# Patient Record
Sex: Female | Born: 1955 | Race: White | Hispanic: No | Marital: Married | Smoking: Never smoker
Health system: Southern US, Community
[De-identification: ages and names within clinical notes are randomized; demographics above are authoritative.]

## PROBLEM LIST (undated history)

## (undated) DIAGNOSIS — E041 Nontoxic single thyroid nodule: Secondary | ICD-10-CM

## (undated) DIAGNOSIS — K219 Gastro-esophageal reflux disease without esophagitis: Secondary | ICD-10-CM

## (undated) DIAGNOSIS — J302 Other seasonal allergic rhinitis: Secondary | ICD-10-CM

## (undated) DIAGNOSIS — E785 Hyperlipidemia, unspecified: Secondary | ICD-10-CM

## (undated) HISTORY — DX: Hyperlipidemia, unspecified: E78.5

---

## 1982-05-08 HISTORY — PX: WISDOM TOOTH EXTRACTION: SHX21

## 2004-06-21 ENCOUNTER — Ambulatory Visit: Payer: Self-pay | Admitting: Family Medicine

## 2004-06-29 ENCOUNTER — Ambulatory Visit: Payer: Self-pay | Admitting: Family Medicine

## 2005-07-12 ENCOUNTER — Ambulatory Visit: Payer: Self-pay | Admitting: Family Medicine

## 2006-10-30 ENCOUNTER — Ambulatory Visit: Payer: Self-pay | Admitting: Family Medicine

## 2012-05-08 HISTORY — PX: OTHER SURGICAL HISTORY: SHX169

## 2012-09-18 ENCOUNTER — Other Ambulatory Visit: Payer: Self-pay | Admitting: Family Medicine

## 2012-11-21 ENCOUNTER — Other Ambulatory Visit: Payer: Self-pay | Admitting: Family Medicine

## 2013-07-15 ENCOUNTER — Ambulatory Visit (INDEPENDENT_AMBULATORY_CARE_PROVIDER_SITE_OTHER): Payer: BC Managed Care – PPO | Admitting: General Surgery

## 2013-07-15 ENCOUNTER — Encounter (INDEPENDENT_AMBULATORY_CARE_PROVIDER_SITE_OTHER): Payer: Self-pay | Admitting: General Surgery

## 2013-07-15 VITALS — BP 144/96 | HR 80 | Temp 98.5°F | Resp 14 | Ht 63.0 in | Wt 187.0 lb

## 2013-07-15 DIAGNOSIS — E041 Nontoxic single thyroid nodule: Secondary | ICD-10-CM

## 2013-07-15 NOTE — Progress Notes (Signed)
Patient ID: NESREEN ALBANO, female   DOB: [birth year], 57 y.o.   MRN: [id number]  Chief Complaint  Patient presents with  . eval thyroid ca    new pt    HPI TARON CONREY is a 58 y.o. female.  We are asked to see the patient in consultation by Dr. Edrick Oh to evaluate her for a left thyroid nodule. The patient is a 58 year old white female who first noticed a swollen area on her left neck about 2 months ago. She denies any neck pain. She denies any difficulty swallowing solids or liquids. She has had some hoarseness over the last couple of days but she does have seasonal allergies. The area was evaluated with ultrasound and there was a 3 cm dominant nodule in the left lobe of the thyroid gland. There were small nodules in the right lobe but they only measured about 3-4 mm. Her thyroid functions were reported as normal. The dominant nodule on the left side was biopsied and came back as follicular but they could not distinguish adenoma from carcinoma  HPI  History reviewed. No pertinent past medical history.  Past Surgical History  Procedure Laterality Date  . Wisdom tooth extraction  1984  . Dental graft  2015    Family History  Problem Relation Age of Onset  . Stroke Mother   . Heart disease Mother   . Hypertension Father   . Diabetes Father     Social History History  Substance Use Topics  . Smoking status: Never Smoker   . Smokeless tobacco: Not on file  . Alcohol Use: Yes    Allergies  Allergen Reactions  . Sulfa Antibiotics     Current Outpatient Prescriptions  Medication Sig Dispense Refill  . aspirin 81 MG tablet Take 81 mg by mouth daily.      . cetirizine (ZYRTEC) 10 MG tablet Take 10 mg by mouth daily.      Marland Kitchen atorvastatin (LIPITOR) 10 MG tablet       . omeprazole (PRILOSEC) 20 MG capsule        No current facility-administered medications for this visit.    Review of Systems Review of Systems  Constitutional: Negative.   HENT: Negative.   Eyes:  Negative.   Respiratory: Negative.   Cardiovascular: Negative.   Gastrointestinal: Negative.   Endocrine: Negative.   Genitourinary: Negative.   Musculoskeletal: Negative.   Skin: Negative.   Allergic/Immunologic: Negative.   Neurological: Negative.   Hematological: Negative.   Psychiatric/Behavioral: Negative.     Blood pressure 144/96, pulse 80, temperature 98.5 F (36.9 C), temperature source Oral, resp. rate 14, height 5\' 3"  (1.6 m), weight 187 lb (84.823 kg).  Physical Exam Physical Exam  Constitutional: She is oriented to person, place, and time. She appears well-developed and well-nourished.  HENT:  Head: Normocephalic and atraumatic.  Eyes: Conjunctivae and EOM are normal. Pupils are equal, round, and reactive to light.  Neck: Normal range of motion. Neck supple.  There is a palpable nodule in the left lobe of the thyroid gland that measures about 3 cm roughly. There is no palpable nodule of the right thyroid lobe. There is no palpable lymphadenopathy in the neck. There is no obvious hoarseness.  Cardiovascular: Normal rate, regular rhythm and normal heart sounds.   Pulmonary/Chest: Effort normal and breath sounds normal.  Abdominal: Soft. Bowel sounds are normal.  Musculoskeletal: Normal range of motion.  Lymphadenopathy:    She has no cervical adenopathy.  Neurological: She is alert and  oriented to person, place, and time.  Skin: Skin is warm and dry.  Psychiatric: She has a normal mood and affect. Her behavior is normal.    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 Because we cannot distinguish benign for malignant on fine needle biopsy I think that she will require a left thyroid lobectomy. We will examine the right side at the time of the surgery and if the right side is abnormal on the right side may need to be removed as well. I've discussed with her in detail the  risks and benefits of the operation to remove the thyroid gland as well as some of the technical aspects including the risk of injury to the recurrent laryngeal nerve and parathyroid glands and she understands and wishes to proceed     Plan    Plan for left thyroid lobectomy and possible total thyroidectomy        TOTH III,Tamsen Reist S 07/15/2013, 10:47 AM

## 2013-07-15 NOTE — Patient Instructions (Signed)
Plan for left thyroid lobectomy and possible total

## 2013-07-30 ENCOUNTER — Encounter (HOSPITAL_COMMUNITY): Payer: Self-pay | Admitting: Pharmacy Technician

## 2013-08-04 ENCOUNTER — Ambulatory Visit (HOSPITAL_COMMUNITY)
Admission: RE | Admit: 2013-08-04 | Discharge: 2013-08-04 | Disposition: A | Discharge status: Home / Self Care | Payer: BC Managed Care – PPO | Source: Ambulatory Visit | Attending: General Surgery | Admitting: General Surgery

## 2013-08-04 ENCOUNTER — Encounter (HOSPITAL_COMMUNITY)
Admission: RE | Admit: 2013-08-04 | Discharge: 2013-08-04 | Disposition: A | Discharge status: Home / Self Care | Payer: BC Managed Care – PPO | Source: Ambulatory Visit | Attending: General Surgery | Admitting: General Surgery

## 2013-08-04 ENCOUNTER — Encounter (HOSPITAL_COMMUNITY): Payer: Self-pay

## 2013-08-04 DIAGNOSIS — M4130 Thoracogenic scoliosis, site unspecified: Secondary | ICD-10-CM | POA: Insufficient documentation

## 2013-08-04 DIAGNOSIS — Z01818 Encounter for other preprocedural examination: Secondary | ICD-10-CM | POA: Insufficient documentation

## 2013-08-04 DIAGNOSIS — Z01812 Encounter for preprocedural laboratory examination: Secondary | ICD-10-CM | POA: Insufficient documentation

## 2013-08-04 HISTORY — DX: Nontoxic single thyroid nodule: E04.1

## 2013-08-04 HISTORY — DX: Gastro-esophageal reflux disease without esophagitis: K21.9

## 2013-08-04 LAB — BASIC METABOLIC PANEL
BUN: 12 mg/dL (ref 6–23)
CO2: 28 mEq/L (ref 19–32)
Calcium: 10 mg/dL (ref 8.4–10.5)
Chloride: 103 mEq/L (ref 96–112)
Creatinine, Ser: 0.73 mg/dL (ref 0.50–1.10)
Glucose, Bld: 92 mg/dL (ref 70–99)
POTASSIUM: 4.4 meq/L (ref 3.7–5.3)
SODIUM: 141 meq/L (ref 137–147)

## 2013-08-04 LAB — CBC
HCT: 43.7 % (ref 36.0–46.0)
HEMOGLOBIN: 14.6 g/dL (ref 12.0–15.0)
MCH: 30.3 pg (ref 26.0–34.0)
MCHC: 33.4 g/dL (ref 30.0–36.0)
MCV: 90.7 fL (ref 78.0–100.0)
Platelets: 272 10*3/uL (ref 150–400)
RBC: 4.82 MIL/uL (ref 3.87–5.11)
RDW: 12.4 % (ref 11.5–15.5)
WBC: 4.3 10*3/uL (ref 4.0–10.5)

## 2013-08-04 NOTE — Patient Instructions (Signed)
   YOUR SURGERY IS SCHEDULED AT [facility]  ON:  Friday  4/10  REPORT TO  SHORT STAY CENTER AT:  7:30 AM      PHONE # FOR SHORT STAY IS [phone]  DO NOT EAT OR DRINK ANYTHING AFTER MIDNIGHT THE NIGHT BEFORE YOUR SURGERY.  YOU MAY BRUSH YOUR TEETH, RINSE OUT YOUR MOUTH--BUT NO WATER, NO FOOD, NO CHEWING GUM, NO MINTS, NO CANDIES, NO CHEWING TOBACCO.  PLEASE TAKE THE FOLLOWING MEDICATIONS THE AM OF YOUR SURGERY WITH A FEW SIPS OF WATER:  NO MEDS TO TAKE    DO NOT BRING VALUABLES, MONEY, CREDIT CARDS.  DO NOT WEAR JEWELRY, MAKE-UP, NAIL POLISH AND NO METAL PINS OR CLIPS IN YOUR HAIR. CONTACT LENS, DENTURES / PARTIALS, GLASSES SHOULD NOT BE WORN TO SURGERY AND IN MOST CASES-HEARING AIDS WILL NEED TO BE REMOVED.  BRING YOUR GLASSES CASE, ANY EQUIPMENT NEEDED FOR YOUR CONTACT LENS. FOR PATIENTS ADMITTED TO THE HOSPITAL--CHECK OUT TIME THE DAY OF DISCHARGE IS 11:00 AM.  ALL INPATIENT ROOMS ARE PRIVATE - WITH BATHROOM, TELEPHONE, TELEVISION AND WIFI INTERNET.                                                     FAILURE TO FOLLOW THESE INSTRUCTIONS MAY RESULT IN THE CANCELLATION OF YOUR SURGERY. PLEASE BE AWARE THAT YOU MAY NEED ADDITIONAL BLOOD DRAWN DAY OF YOUR SURGERY  PATIENT SIGNATURE_________________________________

## 2013-08-04 NOTE — Pre-Procedure Instructions (Signed)
CXR WAS DONE TODAY PREOP AT [facility];  EKG NOT NEEDED - PER ANESTHESIOLOGIST'S GUIDELINES. 

## 2013-08-15 ENCOUNTER — Encounter (HOSPITAL_COMMUNITY)
Admission: RE | Disposition: A | Discharge status: Home / Self Care | Payer: Self-pay | Source: Ambulatory Visit | Attending: General Surgery

## 2013-08-15 ENCOUNTER — Encounter (HOSPITAL_COMMUNITY): Payer: Self-pay | Admitting: *Deleted

## 2013-08-15 ENCOUNTER — Ambulatory Visit (HOSPITAL_COMMUNITY)
Admission: RE | Admit: 2013-08-15 | Discharge: 2013-08-15 | Disposition: A | Discharge status: Home / Self Care | Payer: BC Managed Care – PPO | Source: Ambulatory Visit | Attending: General Surgery | Admitting: General Surgery

## 2013-08-15 ENCOUNTER — Encounter (HOSPITAL_COMMUNITY): Payer: BC Managed Care – PPO | Admitting: Anesthesiology

## 2013-08-15 ENCOUNTER — Ambulatory Visit (HOSPITAL_COMMUNITY): Payer: BC Managed Care – PPO | Admitting: Anesthesiology

## 2013-08-15 DIAGNOSIS — E041 Nontoxic single thyroid nodule: Secondary | ICD-10-CM | POA: Insufficient documentation

## 2013-08-15 DIAGNOSIS — Z538 Procedure and treatment not carried out for other reasons: Secondary | ICD-10-CM | POA: Insufficient documentation

## 2013-08-15 DIAGNOSIS — Z7982 Long term (current) use of aspirin: Secondary | ICD-10-CM | POA: Insufficient documentation

## 2013-08-15 DIAGNOSIS — Z79899 Other long term (current) drug therapy: Secondary | ICD-10-CM | POA: Insufficient documentation

## 2013-08-15 SURGERY — LOBECTOMY, THYROID
Anesthesia: General | Laterality: Left

## 2013-08-15 MED ORDER — ROCURONIUM BROMIDE 100 MG/10ML IV SOLN
INTRAVENOUS | Status: AC
  Filled 2013-08-15: qty 1

## 2013-08-15 MED ORDER — MIDAZOLAM HCL 2 MG/2ML IJ SOLN
INTRAMUSCULAR | Status: AC
  Filled 2013-08-15: qty 2

## 2013-08-15 MED ORDER — HYDROMORPHONE HCL PF 2 MG/ML IJ SOLN
INTRAMUSCULAR | Status: AC
  Filled 2013-08-15: qty 1

## 2013-08-15 MED ORDER — ONDANSETRON HCL 4 MG/2ML IJ SOLN
INTRAMUSCULAR | Status: AC
  Filled 2013-08-15: qty 2

## 2013-08-15 MED ORDER — CEFAZOLIN SODIUM-DEXTROSE 2-3 GM-% IV SOLR: 2.0000 g | INTRAVENOUS | Status: DC

## 2013-08-15 MED ORDER — LIDOCAINE HCL (CARDIAC) 20 MG/ML IV SOLN
INTRAVENOUS | Status: AC
  Filled 2013-08-15: qty 5

## 2013-08-15 MED ORDER — FENTANYL CITRATE 0.05 MG/ML IJ SOLN
INTRAMUSCULAR | Status: AC
  Filled 2013-08-15: qty 5

## 2013-08-15 MED ORDER — SUCCINYLCHOLINE CHLORIDE 20 MG/ML IJ SOLN
INTRAMUSCULAR | Status: AC
  Filled 2013-08-15: qty 1

## 2013-08-15 MED ORDER — PROPOFOL 10 MG/ML IV BOLUS
INTRAVENOUS | Status: AC
  Filled 2013-08-15: qty 20

## 2013-08-15 NOTE — Anesthesia Preprocedure Evaluation (Signed)
Anesthesia Evaluation  Patient identified by MRN, date of birth, ID band Patient awake    Reviewed: Allergy & Precautions, H&P , NPO status , Patient's Chart, lab work & pertinent test results  Airway Mallampati: II TM Distance: >3 FB Neck ROM: full    Dental no notable dental hx.    Pulmonary neg pulmonary ROS,  breath sounds clear to auscultation  Pulmonary exam normal       Cardiovascular Exercise Tolerance: Good negative cardio ROS  Rhythm:regular Rate:Normal     Neuro/Psych negative neurological ROS  negative psych ROS   GI/Hepatic negative GI ROS, Neg liver ROS, GERD-  Medicated and Controlled,  Endo/Other  negative endocrine ROS  Renal/GU negative Renal ROS  negative genitourinary   Musculoskeletal   Abdominal   Peds  Hematology negative hematology ROS (+)   Anesthesia Other Findings   Reproductive/Obstetrics negative OB ROS                           Anesthesia Physical Anesthesia Plan  ASA: II  Anesthesia Plan: General   Post-op Pain Management:    Induction: Intravenous  Airway Management Planned: Oral ETT  Additional Equipment:   Intra-op Plan:   Post-operative Plan: Extubation in OR  Informed Consent: I have reviewed the patients History and Physical, chart, labs and discussed the procedure including the risks, benefits and alternatives for the proposed anesthesia with the patient or authorized representative who has indicated his/her understanding and acceptance.   Dental Advisory Given  Plan Discussed with: CRNA and Surgeon  Anesthesia Plan Comments:         Anesthesia Quick Evaluation  

## 2013-08-21 NOTE — H&P (Signed)
  Kimberly Sparks  07/15/2013 10:10 AM   Office Visit  MRN:  [id number]   Description: 57 year old female  Provider: Paul S Toth III, MD  Department: [facility]         Guarantor Account: Loeber, Sherly G ([id number])      Relation to Patient: Account Type Service Area      Self Personal/Family [place] SERVICE AREA              Coverages for This Account     Coverage ID Payor Plan Insurance ID      [id number] BLUE CROSS BLUE SHIELD BCBS PPO OUT OF STATE [id number]              Guarantor Account: Wimbish, Elyanna G ([id number])      Relation to Patient: Account Type Service Area      Self Personal/Family [facility]                 Guarantor Account: Eisel, Rayaan G ([id number])      Relation to Patient: Account Type Service Area      Self Personal/Family [facility]                 Guarantor Account: Zacher, Niasia G ([id number])      Relation to Patient: Account Type Service Area      Self Personal/Family [facility] SERV[facility]              Coverages for This Account     Coverage ID Payor Plan Insurance ID      [id number] BLUE CROSS BLUE SHIELD BCBS PPO OUT OF STATE [id number]                Diagnoses      Left thyroid nodule    -  Primary      241.0             Reason for Visit      eval thyroid ca      new pt               Current Vitals Most recent update: 07/15/2013 10:08 AM by Sonya M Bynum, CMA      BP Pulse Temp(Src) Resp Ht Wt      144/96 80 98.5 F (36.9 C) (Oral) 14 5' 3" (1.6 m) 187 lb (84.823 kg)      BMI                33.13 kg/m2                        Progress Notes      Paul S Toth III, MD at 07/15/2013 10:47 AM      Status: Signed            Patient ID: Kimberly Sparks, female   DOB: 09/22/1955, 57 y.o.   MRN: [id number]    Chief Complaint   Patient presents with   .  eval thyroid ca       new pt        HPI Kimberly G  Sparks is a 57 y.o. female.  We are asked to see the patient in consultation by Dr. Nyland to evaluate her for a left thyroid nodule. The patient is a 57-year-old white female who first noticed a swollen area on her left neck about 2 months ago. She   denies any neck pain. She denies any difficulty swallowing solids or liquids. She has had some hoarseness over the last couple of days but she does have seasonal allergies. The area was evaluated with ultrasound and there was a 3 cm dominant nodule in the left lobe of the thyroid gland. There were small nodules in the right lobe but they only measured about 3-4 mm. Her thyroid functions were reported as normal. The dominant nodule on the left side was biopsied and came back as follicular but they could not distinguish adenoma from carcinoma  HPI   History reviewed. No pertinent past medical history.    Past Surgical History   Procedure  Laterality  Date   .  Wisdom tooth extraction    1984   .  Dental graft    2015         Family History   Problem  Relation  Age of Onset   .  Stroke  Mother     .  Heart disease  Mother     .  Hypertension  Father     .  Diabetes  Father          Social History History   Substance Use Topics   .  Smoking status:  Never Smoker    .  Smokeless tobacco:  Not on file   .  Alcohol Use:  Yes         Allergies   Allergen  Reactions   .  Sulfa Antibiotics           Current Outpatient Prescriptions   Medication  Sig  Dispense  Refill   .  aspirin 81 MG tablet  Take 81 mg by mouth daily.         .  cetirizine (ZYRTEC) 10 MG tablet  Take 10 mg by mouth daily.         Marland Kitchen  atorvastatin (LIPITOR) 10 MG tablet           .  omeprazole (PRILOSEC) 20 MG capsule               No current facility-administered medications for this visit.        Review of Systems Review of Systems  Constitutional: Negative.   HENT: Negative.   Eyes: Negative.   Respiratory: Negative.   Cardiovascular: Negative.    Gastrointestinal: Negative.   Endocrine: Negative.   Genitourinary: Negative.   Musculoskeletal: Negative.   Skin: Negative.   Allergic/Immunologic: Negative.   Neurological: Negative.   Hematological: Negative.   Psychiatric/Behavioral: Negative.       Blood pressure 144/96, pulse 80, temperature 98.5 F (36.9 C), temperature source Oral, resp. rate 14, height 5\' 3"  (1.6 m), weight 187 lb (84.823 kg).   Physical Exam Physical Exam  Constitutional: She is oriented to person, place, and time. She appears well-developed and well-nourished.  HENT:   Head: Normocephalic and atraumatic.  Eyes: Conjunctivae and EOM are normal. Pupils are equal, round, and reactive to light.  Neck: Normal range of motion. Neck supple.  There is a palpable nodule in the left lobe of the thyroid gland that measures about 3 cm roughly. There is no palpable nodule of the right thyroid lobe. There is no palpable lymphadenopathy in the neck. There is no obvious hoarseness.  Cardiovascular: Normal rate, regular rhythm and normal heart sounds.   Pulmonary/Chest: Effort normal and breath sounds normal.  Abdominal: Soft. Bowel sounds are normal.  Musculoskeletal: Normal range of motion.  Lymphadenopathy:    She has no cervical adenopathy.  Neurological: She is alert and oriented to person, place, and time.  Skin: Skin is warm and dry.  Psychiatric: She has a normal mood and affect. Her behavior is normal.      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 Because we cannot distinguish benign for malignant on fine needle biopsy I think that she will require a left thyroid lobectomy. We will examine the right side at the time of the surgery and if the right side is abnormal on the right side may need to be removed as well. I've discussed with her in detail the risks and benefits of the operation to remove the  thyroid gland as well as some of the technical aspects including the risk of injury to the recurrent laryngeal nerve and parathyroid glands and she understands and wishes to proceed      Plan    Plan for left thyroid lobectomy and possible total thyroidectomy         

## 2013-08-22 MED ORDER — LACTATED RINGERS IV SOLN: INTRAVENOUS | Status: DC

## 2013-08-22 MED ORDER — HYDROMORPHONE HCL PF 1 MG/ML IJ SOLN: 0.2500 mg | INTRAMUSCULAR | Status: DC | PRN

## 2013-08-25 ENCOUNTER — Encounter (HOSPITAL_COMMUNITY): Payer: Self-pay | Admitting: *Deleted

## 2013-08-26 ENCOUNTER — Encounter (INDEPENDENT_AMBULATORY_CARE_PROVIDER_SITE_OTHER): Payer: Self-pay

## 2013-08-26 ENCOUNTER — Ambulatory Visit (HOSPITAL_COMMUNITY): Payer: BC Managed Care – PPO | Admitting: Anesthesiology

## 2013-08-26 ENCOUNTER — Encounter (HOSPITAL_COMMUNITY): Payer: BC Managed Care – PPO | Admitting: Anesthesiology

## 2013-08-26 ENCOUNTER — Ambulatory Visit (HOSPITAL_COMMUNITY)
Admission: RE | Admit: 2013-08-26 | Discharge: 2013-08-27 | Disposition: A | Discharge status: Home / Self Care | Payer: BC Managed Care – PPO | Source: Ambulatory Visit | Attending: General Surgery | Admitting: General Surgery

## 2013-08-26 ENCOUNTER — Encounter (HOSPITAL_COMMUNITY)
Admission: RE | Disposition: A | Discharge status: Home / Self Care | Payer: Self-pay | Source: Ambulatory Visit | Attending: General Surgery

## 2013-08-26 ENCOUNTER — Ambulatory Visit (HOSPITAL_COMMUNITY): Admission: RE | Admit: 2013-08-26 | Payer: BC Managed Care – PPO | Source: Ambulatory Visit | Admitting: General Surgery

## 2013-08-26 ENCOUNTER — Encounter (HOSPITAL_COMMUNITY): Admission: RE | Payer: Self-pay | Source: Ambulatory Visit

## 2013-08-26 ENCOUNTER — Encounter (HOSPITAL_COMMUNITY): Payer: Self-pay | Admitting: Anesthesiology

## 2013-08-26 DIAGNOSIS — E063 Autoimmune thyroiditis: Secondary | ICD-10-CM

## 2013-08-26 DIAGNOSIS — K219 Gastro-esophageal reflux disease without esophagitis: Secondary | ICD-10-CM | POA: Insufficient documentation

## 2013-08-26 DIAGNOSIS — E042 Nontoxic multinodular goiter: Secondary | ICD-10-CM | POA: Insufficient documentation

## 2013-08-26 DIAGNOSIS — E041 Nontoxic single thyroid nodule: Secondary | ICD-10-CM

## 2013-08-26 HISTORY — DX: Other seasonal allergic rhinitis: J30.2

## 2013-08-26 HISTORY — PX: THYROID LOBECTOMY: SHX420

## 2013-08-26 LAB — BASIC METABOLIC PANEL
BUN: 19 mg/dL (ref 6–23)
CO2: 23 mEq/L (ref 19–32)
Calcium: 9.3 mg/dL (ref 8.4–10.5)
Chloride: 107 mEq/L (ref 96–112)
Creatinine, Ser: 0.83 mg/dL (ref 0.50–1.10)
GFR calc Af Amer: 90 mL/min — ABNORMAL LOW (ref >90)
GFR, EST NON AFRICAN AMERICAN: 77 mL/min — AB (ref >90)
Glucose, Bld: 97 mg/dL (ref 70–99)
POTASSIUM: 4.2 meq/L (ref 3.7–5.3)
SODIUM: 144 meq/L (ref 137–147)

## 2013-08-26 LAB — CBC
HEMATOCRIT: 40.6 % (ref 36.0–46.0)
Hemoglobin: 13.8 g/dL (ref 12.0–15.0)
MCH: 30.9 pg (ref 26.0–34.0)
MCHC: 34 g/dL (ref 30.0–36.0)
MCV: 91 fL (ref 78.0–100.0)
Platelets: 244 10*3/uL (ref 150–400)
RBC: 4.46 MIL/uL (ref 3.87–5.11)
RDW: 12.6 % (ref 11.5–15.5)
WBC: 4.7 10*3/uL (ref 4.0–10.5)

## 2013-08-26 SURGERY — LOBECTOMY, THYROID
Anesthesia: General | Laterality: Left

## 2013-08-26 MED ORDER — OXYCODONE HCL 5 MG/5ML PO SOLN
5.0000 mg | Freq: Once | ORAL | Status: AC | PRN
  Administered 2013-08-26: 5 mg via ORAL

## 2013-08-26 MED ORDER — PROMETHAZINE HCL 25 MG/ML IJ SOLN: 6.2500 mg | INTRAMUSCULAR | Status: DC | PRN

## 2013-08-26 MED ORDER — GLYCOPYRROLATE 0.2 MG/ML IJ SOLN
INTRAMUSCULAR | Status: DC | PRN
  Administered 2013-08-26: 0.4 mg via INTRAVENOUS

## 2013-08-26 MED ORDER — PROMETHAZINE HCL 25 MG/ML IJ SOLN
12.5000 mg | Freq: Once | INTRAMUSCULAR | Status: AC
  Administered 2013-08-26: 12.5 mg via INTRAVENOUS
  Filled 2013-08-26: qty 1

## 2013-08-26 MED ORDER — CEFAZOLIN SODIUM-DEXTROSE 2-3 GM-% IV SOLR
2.0000 g | Freq: Once | INTRAVENOUS | Status: AC
  Administered 2013-08-26: 2 g via INTRAVENOUS
  Filled 2013-08-26: qty 50

## 2013-08-26 MED ORDER — PHENYLEPHRINE 40 MCG/ML (10ML) SYRINGE FOR IV PUSH (FOR BLOOD PRESSURE SUPPORT)
PREFILLED_SYRINGE | INTRAVENOUS | Status: AC
  Filled 2013-08-26: qty 10

## 2013-08-26 MED ORDER — GLYCOPYRROLATE 0.2 MG/ML IJ SOLN
INTRAMUSCULAR | Status: AC
  Filled 2013-08-26: qty 1

## 2013-08-26 MED ORDER — FENTANYL CITRATE 0.05 MG/ML IJ SOLN
INTRAMUSCULAR | Status: AC
  Filled 2013-08-26: qty 5

## 2013-08-26 MED ORDER — ROCURONIUM BROMIDE 100 MG/10ML IV SOLN
INTRAVENOUS | Status: DC | PRN
Start: 2013-08-26 — End: 2013-08-26
  Administered 2013-08-26: 50 mg via INTRAVENOUS

## 2013-08-26 MED ORDER — HYDROMORPHONE HCL PF 1 MG/ML IJ SOLN
INTRAMUSCULAR | Status: AC
  Filled 2013-08-26: qty 1

## 2013-08-26 MED ORDER — PROPOFOL 10 MG/ML IV BOLUS
INTRAVENOUS | Status: DC | PRN
Start: 2013-08-26 — End: 2013-08-26
  Administered 2013-08-26: 200 mg via INTRAVENOUS

## 2013-08-26 MED ORDER — LIDOCAINE HCL (CARDIAC) 20 MG/ML IV SOLN
INTRAVENOUS | Status: AC
  Filled 2013-08-26: qty 5

## 2013-08-26 MED ORDER — MIDAZOLAM HCL 5 MG/5ML IJ SOLN
INTRAMUSCULAR | Status: DC | PRN
  Administered 2013-08-26: 2 mg via INTRAVENOUS

## 2013-08-26 MED ORDER — MIDAZOLAM HCL 2 MG/2ML IJ SOLN
INTRAMUSCULAR | Status: AC
  Filled 2013-08-26: qty 2

## 2013-08-26 MED ORDER — PHENYLEPHRINE HCL 10 MG/ML IJ SOLN
INTRAMUSCULAR | Status: DC | PRN
  Administered 2013-08-26 (×2): 40 ug via INTRAVENOUS
  Administered 2013-08-26 (×3): 80 ug via INTRAVENOUS
  Administered 2013-08-26: 40 ug via INTRAVENOUS

## 2013-08-26 MED ORDER — ONDANSETRON HCL 4 MG/2ML IJ SOLN
INTRAMUSCULAR | Status: DC | PRN
  Administered 2013-08-26: 4 mg via INTRAVENOUS

## 2013-08-26 MED ORDER — EPHEDRINE SULFATE 50 MG/ML IJ SOLN
INTRAMUSCULAR | Status: AC
  Filled 2013-08-26: qty 1

## 2013-08-26 MED ORDER — PROPOFOL 10 MG/ML IV BOLUS
INTRAVENOUS | Status: AC
  Filled 2013-08-26: qty 20

## 2013-08-26 MED ORDER — SODIUM CHLORIDE 0.9 % IJ SOLN
INTRAMUSCULAR | Status: AC
  Filled 2013-08-26: qty 10

## 2013-08-26 MED ORDER — HEMOSTATIC AGENTS (NO CHARGE) OPTIME
TOPICAL | Status: DC | PRN
  Administered 2013-08-26: 1 via TOPICAL

## 2013-08-26 MED ORDER — OXYCODONE HCL 5 MG PO TABS: 5.0000 mg | ORAL_TABLET | Freq: Once | ORAL | Status: DC | PRN

## 2013-08-26 MED ORDER — OXYCODONE HCL 5 MG/5ML PO SOLN: 5.0000 mg | Freq: Once | ORAL | Status: DC | PRN

## 2013-08-26 MED ORDER — SUCCINYLCHOLINE CHLORIDE 20 MG/ML IJ SOLN
INTRAMUSCULAR | Status: AC
  Filled 2013-08-26: qty 1

## 2013-08-26 MED ORDER — OXYCODONE-ACETAMINOPHEN 5-325 MG PO TABS
1.0000 | ORAL_TABLET | ORAL | Status: DC | PRN
  Administered 2013-08-26 – 2013-08-27 (×2): 1 via ORAL
  Filled 2013-08-26 (×2): qty 1

## 2013-08-26 MED ORDER — LORATADINE 10 MG PO TABS
10.0000 mg | ORAL_TABLET | Freq: Every day | ORAL | Status: DC
  Administered 2013-08-26 – 2013-08-27 (×2): 10 mg via ORAL
  Filled 2013-08-26 (×2): qty 1

## 2013-08-26 MED ORDER — LIDOCAINE HCL (CARDIAC) 20 MG/ML IV SOLN
INTRAVENOUS | Status: DC | PRN
  Administered 2013-08-26: 50 mg via INTRATRACHEAL
  Administered 2013-08-26: 80 mg via INTRAVENOUS

## 2013-08-26 MED ORDER — KCL IN DEXTROSE-NACL 20-5-0.9 MEQ/L-%-% IV SOLN
INTRAVENOUS | Status: DC
  Administered 2013-08-26: 12:00:00 via INTRAVENOUS
  Filled 2013-08-26 (×4): qty 1000

## 2013-08-26 MED ORDER — CALCIUM CARBONATE-VITAMIN D 500-200 MG-UNIT PO TABS
1.0000 | ORAL_TABLET | Freq: Every day | ORAL | Status: DC
  Administered 2013-08-26 – 2013-08-27 (×2): 1 via ORAL
  Filled 2013-08-26 (×2): qty 1

## 2013-08-26 MED ORDER — HYDROMORPHONE HCL PF 1 MG/ML IJ SOLN
0.2500 mg | INTRAMUSCULAR | Status: DC | PRN
  Administered 2013-08-26: 0.5 mg via INTRAVENOUS

## 2013-08-26 MED ORDER — LACTATED RINGERS IV SOLN
INTRAVENOUS | Status: DC
  Administered 2013-08-26: 07:00:00 via INTRAVENOUS

## 2013-08-26 MED ORDER — OXYCODONE HCL 5 MG PO TABS: 5.0000 mg | ORAL_TABLET | Freq: Once | ORAL | Status: AC | PRN

## 2013-08-26 MED ORDER — ONDANSETRON HCL 4 MG/2ML IJ SOLN
4.0000 mg | Freq: Four times a day (QID) | INTRAMUSCULAR | Status: DC | PRN
  Administered 2013-08-26: 4 mg via INTRAVENOUS
  Filled 2013-08-26: qty 2

## 2013-08-26 MED ORDER — MORPHINE SULFATE 4 MG/ML IJ SOLN: 4.0000 mg | INTRAMUSCULAR | Status: DC | PRN

## 2013-08-26 MED ORDER — NEOSTIGMINE METHYLSULFATE 1 MG/ML IJ SOLN
INTRAMUSCULAR | Status: DC | PRN
  Administered 2013-08-26: 3 mg via INTRAVENOUS

## 2013-08-26 MED ORDER — OXYCODONE HCL 5 MG/5ML PO SOLN
ORAL | Status: AC
  Filled 2013-08-26: qty 5

## 2013-08-26 MED ORDER — ONDANSETRON HCL 4 MG PO TABS: 4.0000 mg | ORAL_TABLET | Freq: Four times a day (QID) | ORAL | Status: DC | PRN

## 2013-08-26 MED ORDER — ROCURONIUM BROMIDE 50 MG/5ML IV SOLN
INTRAVENOUS | Status: AC
  Filled 2013-08-26: qty 1

## 2013-08-26 MED ORDER — 0.9 % SODIUM CHLORIDE (POUR BTL) OPTIME
TOPICAL | Status: DC | PRN
  Administered 2013-08-26: 1000 mL

## 2013-08-26 MED ORDER — DEXAMETHASONE SODIUM PHOSPHATE 4 MG/ML IJ SOLN
INTRAMUSCULAR | Status: DC | PRN
Start: 2013-08-26 — End: 2013-08-26
  Administered 2013-08-26: 4 mg via INTRAVENOUS

## 2013-08-26 MED ORDER — HYDROMORPHONE HCL PF 1 MG/ML IJ SOLN
0.2500 mg | INTRAMUSCULAR | Status: DC | PRN
  Administered 2013-08-26 (×2): 0.5 mg via INTRAVENOUS

## 2013-08-26 MED ORDER — LACTATED RINGERS IV SOLN
INTRAVENOUS | Status: DC | PRN
  Administered 2013-08-26 (×2): via INTRAVENOUS

## 2013-08-26 MED ORDER — PROMETHAZINE HCL 25 MG/ML IJ SOLN
6.2500 mg | INTRAMUSCULAR | Status: DC | PRN
Start: 2013-08-26 — End: 2013-08-26

## 2013-08-26 MED ORDER — FENTANYL CITRATE 0.05 MG/ML IJ SOLN
INTRAMUSCULAR | Status: DC | PRN
  Administered 2013-08-26: 100 ug via INTRAVENOUS
  Administered 2013-08-26 (×3): 50 ug via INTRAVENOUS

## 2013-08-26 MED ORDER — ATORVASTATIN CALCIUM 10 MG PO TABS
10.0000 mg | ORAL_TABLET | Freq: Every day | ORAL | Status: DC
  Administered 2013-08-26: 10 mg via ORAL
  Filled 2013-08-26 (×2): qty 1

## 2013-08-26 MED ORDER — ONDANSETRON HCL 4 MG/2ML IJ SOLN
INTRAMUSCULAR | Status: AC
  Filled 2013-08-26: qty 2

## 2013-08-26 MED ORDER — PANTOPRAZOLE SODIUM 40 MG PO TBEC
40.0000 mg | DELAYED_RELEASE_TABLET | Freq: Every day | ORAL | Status: DC
  Administered 2013-08-26 – 2013-08-27 (×2): 40 mg via ORAL
  Filled 2013-08-26 (×2): qty 1

## 2013-08-26 SURGICAL SUPPLY — 52 items
BLADE 10 SAFETY STRL DISP (BLADE) ×3 IMPLANT
BLADE 15 SAFETY STRL DISP (BLADE) ×3 IMPLANT
BLADE SURG ROTATE 9660 (MISCELLANEOUS) IMPLANT
CANISTER SUCTION 2500CC (MISCELLANEOUS) ×3 IMPLANT
CHLORAPREP W/TINT 10.5 ML (MISCELLANEOUS) ×3 IMPLANT
CLIP TI MEDIUM 24 (CLIP) ×3 IMPLANT
CLIP TI WIDE RED SMALL 24 (CLIP) ×3 IMPLANT
COVER SURGICAL LIGHT HANDLE (MISCELLANEOUS) ×3 IMPLANT
CRADLE DONUT ADULT HEAD (MISCELLANEOUS) ×3 IMPLANT
DERMABOND ADVANCED (GAUZE/BANDAGES/DRESSINGS) ×2
DERMABOND ADVANCED .7 DNX12 (GAUZE/BANDAGES/DRESSINGS) ×1 IMPLANT
DRAPE PED LAPAROTOMY (DRAPES) ×3 IMPLANT
DRAPE UTILITY 15X26 W/TAPE STR (DRAPE) ×6 IMPLANT
ELECT CAUTERY BLADE 6.4 (BLADE) ×3 IMPLANT
ELECT REM PT RETURN 9FT ADLT (ELECTROSURGICAL) ×3
ELECTRODE REM PT RTRN 9FT ADLT (ELECTROSURGICAL) ×1 IMPLANT
GAUZE SPONGE 4X4 16PLY XRAY LF (GAUZE/BANDAGES/DRESSINGS) ×6 IMPLANT
GLOVE BIO SURGEON STRL SZ7.5 (GLOVE) ×6 IMPLANT
GLOVE BIOGEL PI IND STRL 6.5 (GLOVE) ×1 IMPLANT
GLOVE BIOGEL PI IND STRL 8 (GLOVE) ×1 IMPLANT
GLOVE BIOGEL PI INDICATOR 6.5 (GLOVE) ×2
GLOVE BIOGEL PI INDICATOR 8 (GLOVE) ×2
GLOVE ECLIPSE 8.0 STRL XLNG CF (GLOVE) ×3 IMPLANT
GLOVE SURG ORTHO 8.0 STRL STRW (GLOVE) ×3 IMPLANT
GLOVE SURG SS PI 6.5 STRL IVOR (GLOVE) ×3 IMPLANT
GOWN STRL REUS W/ TWL LRG LVL3 (GOWN DISPOSABLE) ×4 IMPLANT
GOWN STRL REUS W/TWL LRG LVL3 (GOWN DISPOSABLE) ×8
GOWN STRL REUS W/TWL XL LVL4 (GOWN DISPOSABLE) ×3 IMPLANT
HEMOSTAT SNOW SURGICEL 2X4 (HEMOSTASIS) ×6 IMPLANT
KIT BASIN OR (CUSTOM PROCEDURE TRAY) ×3 IMPLANT
KIT ROOM TURNOVER OR (KITS) ×3 IMPLANT
NS IRRIG 1000ML POUR BTL (IV SOLUTION) ×3 IMPLANT
PACK SURGICAL SETUP 50X90 (CUSTOM PROCEDURE TRAY) ×3 IMPLANT
PAD ARMBOARD 7.5X6 YLW CONV (MISCELLANEOUS) ×3 IMPLANT
PENCIL BUTTON HOLSTER BLD 10FT (ELECTRODE) ×3 IMPLANT
SHEARS HARMONIC 9CM CVD (BLADE) ×3 IMPLANT
SPECIMEN JAR MEDIUM (MISCELLANEOUS) ×3 IMPLANT
SPONGE INTESTINAL PEANUT (DISPOSABLE) ×3 IMPLANT
SUT MNCRL AB 4-0 PS2 18 (SUTURE) ×3 IMPLANT
SUT SILK 2 0 (SUTURE) ×2
SUT SILK 2 0 SH (SUTURE) ×3 IMPLANT
SUT SILK 2-0 18XBRD TIE 12 (SUTURE) ×1 IMPLANT
SUT SILK 3 0 (SUTURE) ×2
SUT SILK 3-0 18XBRD TIE 12 (SUTURE) ×1 IMPLANT
SUT VIC AB 3-0 SH 18 (SUTURE) ×3 IMPLANT
SUT VIC AB 3-0 SH 8-18 (SUTURE) ×3 IMPLANT
SYR BULB 3OZ (MISCELLANEOUS) ×3 IMPLANT
TOWEL OR 17X24 6PK STRL BLUE (TOWEL DISPOSABLE) ×3 IMPLANT
TOWEL OR 17X26 10 PK STRL BLUE (TOWEL DISPOSABLE) ×3 IMPLANT
TUBE CONNECTING 12'X1/4 (SUCTIONS) ×1
TUBE CONNECTING 12X1/4 (SUCTIONS) ×2 IMPLANT
WATER STERILE IRR 1000ML POUR (IV SOLUTION) IMPLANT

## 2013-08-26 NOTE — Anesthesia Procedure Notes (Signed)
Procedure Name: Intubation Date/Time: 08/26/2013 9:06 AM Performed by: Jenne Campus Pre-anesthesia Checklist: Patient identified, Emergency Drugs available, Patient being monitored, Suction available and Timeout performed Patient Re-evaluated:Patient Re-evaluated prior to inductionOxygen Delivery Method: Circle system utilized Preoxygenation: Pre-oxygenation with 100% oxygen Intubation Type: IV induction Ventilation: Mask ventilation without difficulty and Oral airway inserted - appropriate to patient size Laryngoscope Size: Miller and 2 Grade View: Grade II Tube type: Oral Tube size: 7.0 mm Number of attempts: 1 Airway Equipment and Method: Stylet Placement Confirmation: ETT inserted through vocal cords under direct vision,  positive ETCO2,  CO2 detector and breath sounds checked- equal and bilateral Secured at: 21 cm Tube secured with: Tape Dental Injury: Teeth and Oropharynx as per pre-operative assessment

## 2013-08-26 NOTE — Transfer of Care (Signed)
Immediate Anesthesia Transfer of Care Note  Patient: Kimberly Sparks  Procedure(s) Performed: Procedure(s): LEFT THYROID LOBECTOMY (Left)  Patient Location: PACU  Anesthesia Type:General  Level of Consciousness: awake, oriented and patient cooperative  Airway & Oxygen Therapy: Patient Spontanous Breathing and Patient connected to nasal cannula oxygen  Post-op Assessment: Report given to PACU RN and Post -op Vital signs reviewed and stable  Post vital signs: Reviewed  Complications: No apparent anesthesia complications

## 2013-08-26 NOTE — Op Note (Signed)
08/26/2013  10:13 AM  PATIENT:  Kimberly Sparks  58 y.o. female  PRE-OPERATIVE DIAGNOSIS:  left thyroid nodule  POST-OPERATIVE DIAGNOSIS:  left thyroid nodule  PROCEDURE:  Procedure(s): LEFT THYROID LOBECTOMY (Left)  SURGEON:  Surgeon(s) and Role:    * Merrie Roof, MD - Primary    * Earnstine Regal, MD - Assisting  PHYSICIAN ASSISTANT:   ASSISTANTS: Dr. Harlow Asa   ANESTHESIA:   general  EBL:  Total I/O In: 1000 [I.V.:1000] Out: -   BLOOD ADMINISTERED:none  DRAINS: none   LOCAL MEDICATIONS USED:  NONE  SPECIMEN:  Source of Specimen:  left thyroid lobe  DISPOSITION OF SPECIMEN:  PATHOLOGY  COUNTS:  YES  TOURNIQUET:  * No tourniquets in log *  DICTATION: .Dragon Dictation After informed consent was obtained the patient was brought to the operating room and placed in the supine position on the operating room table. After adequate induction of general anesthesia a roll was placed behind the patient's shoulders to extend her neck slightly. The neck and chest area were then prepped with ChloraPrep, allowed to dry, and draped in usual sterile manner. A low transverse incision was then made with a 15 blade knife about 2 fingerbreadths above the sternal notch in between the sternocleidomastoid muscles. This incision was carried through the skin and subcutaneous tissue sharply with the electrocautery. The platysma layer was also identified and was separated with the electrocautery as well. The platysmal layer was then grasped with Allis clamps and elevated anteriorly. Subplatysmal flaps were created by a combination of sharp and Bovie dissection and blunt finger dissection. Once this was accomplished a Interior and spatial designer was deployed. The dissection was then carried along the midline of the neck until the thyroid gland was identified.  The strap muscles were dissected away from the anterior surface of the thyroid gland on the left side by blunt dissection with a Kitner. A [employer]  retractor was used to retract the strap muscles laterally. Once the anterior surface of the thyroid gland was freed the thyroid gland was then rotated up and medially. Blunt right angle dissection was carried out along the lateral surface of the thyroid gland. Several small vessels were controlled with small clips. The superior pole vessels were identified and dissected bluntly with the right angle clamp. The superior pole vessels were controlled with a medium clip and divided with the harmonic scalpel. As the left lobe of the thyroid gland was rotated anteriorly and medially we identified the area where the recurrent laryngeal nerve and parathyroid glands would be but since her dissection was carried along the surface capsule of the thyroid gland we did not encounter the structures. The isthmus of the thyroid gland was freed from the anterior surface of the trachea by sharp dissection with the electrocautery. Once this was accomplished the isthmus was divided with the harmonic scalpel. The left lobe of the thyroid gland was then removed from the patient. It was marked with a stitch in the superior pole and sent to pathology for further evaluation. The right lobe was evaluated by palpation and there was no obvious masses identified. The right lobe was small and diminutive and felt normal. The isthmus of the thyroid gland that was left behind was controlled with a 4-0 Vicryl U stitch. The outer bed was then irrigated with copious amounts of saline. The area was examined and found to be hemostatic. The operative bed was then coated with some Surgicel snow. The strap muscles were then reapproximated along  the midline with 4-0 Vicryl figure-of-eight stitches. The platysmal layer was also reapproximated with interrupted 4-0 Vicryl stitches. The skin was then reapproximated with a running 4-0 Monocryl subcuticular stitch. Dermabond dressings were applied. The patient tolerated the procedure well. At the end of the case  all needle sponge and instrument counts were correct. The patient was then awakened and taken to recovery in stable condition.  PLAN OF CARE: Admit for overnight observation  PATIENT DISPOSITION:  PACU - hemodynamically stable.   Delay start of Pharmacological VTE agent (>24hrs) due to surgical blood loss or risk of bleeding: not applicable

## 2013-08-26 NOTE — H&P (View-Only) (Signed)
Kimberly Sparks  07/15/2013 10:10 AM   Office Visit  MRN:  [id number]   Description: 58 year old female  Provider: Merrie Roof, MD  Department: [facility]         Guarantor Account: Kimberly Sparks, Kimberly Sparks ([id number])      Relation to Patient: Account Type Service Area      Self Personal/Family [place] for This Account     Coverage ID Payor Plan Insurance ID      [id number] [facility] [id number]              Guarantor Account: Kimberly Sparks, Kimberly Sparks ([id number])      Relation to Patient: Account Type Service Area      Self Personal/Family [facility]                 Guarantor Account: Kimberly Sparks, Kimberly Sparks ([id number])      Relation to Patient: Account Type Service Area      Self Personal/Family [facility]                 Guarantor Account: Kimberly Sparks, Kimberly Sparks ([id number])      Relation to Patient: Account Type Service Area      Self Personal/Family [place] for This Account     Coverage ID Payor Plan Insurance ID      [id number] BLUE [employer]OSS [employer]UE [employer] BC[employer] PPO OUT OF STATE [phone]                Diagnoses      Left thyroid nodule    -  Primary      241.0             Reason for Visit      eval thyroid ca      new pt               Current Vitals Most recent update: 07/15/2013 10:08 AM by [place], CMA      BP Pulse Temp(Src) Resp Ht Wt      144/96 80 98.5 F (36.9 C) (Oral) 14 5\' 3"  (1.6 m) 187 lb (84.823 kg)      BMI                33.13 kg/m2                        Progress Notes      Merrie Roof, MD at 07/15/2013 10:47 AM      Status: Signed            Patient ID: Kimberly Sparks, female   DOB: May 08, 1956, 58 y.o.   MRN: [id number]    Chief Complaint   Patient presents with   .  eval thyroid ca       new pt        HPI Kimberly Sparks is a 58 y.o. female.  We are asked to see the patient in consultation by Dr. Edrick Oh to evaluate her for a left thyroid nodule. The patient is a 58 year old white female who first noticed a swollen area on her left neck about 2 months ago. She  denies any neck pain. She denies any difficulty swallowing solids or liquids. She has had some hoarseness over the last couple of days but she does have seasonal allergies. The area was evaluated with ultrasound and there was a 3 cm dominant nodule in the left lobe of the thyroid gland. There were small nodules in the right lobe but they only measured about 3-4 mm. Her thyroid functions were reported as normal. The dominant nodule on the left side was biopsied and came back as follicular but they could not distinguish adenoma from carcinoma  HPI   History reviewed. No pertinent past medical history.    Past Surgical History   Procedure  Laterality  Date   .  Wisdom tooth extraction    1984   .  Dental graft    2015         Family History   Problem  Relation  Age of Onset   .  Stroke  Mother     .  Heart disease  Mother     .  Hypertension  Father     .  Diabetes  Father          Social History History   Substance Use Topics   .  Smoking status:  Never Smoker    .  Smokeless tobacco:  Not on file   .  Alcohol Use:  Yes         Allergies   Allergen  Reactions   .  Sulfa Antibiotics           Current Outpatient Prescriptions   Medication  Sig  Dispense  Refill   .  aspirin 81 MG tablet  Take 81 mg by mouth daily.         .  cetirizine (ZYRTEC) 10 MG tablet  Take 10 mg by mouth daily.         Marland Kitchen  atorvastatin (LIPITOR) 10 MG tablet           .  omeprazole (PRILOSEC) 20 MG capsule               No current facility-administered medications for this visit.        Review of Systems Review of Systems  Constitutional: Negative.   HENT: Negative.   Eyes: Negative.   Respiratory: Negative.   Cardiovascular: Negative.    Gastrointestinal: Negative.   Endocrine: Negative.   Genitourinary: Negative.   Musculoskeletal: Negative.   Skin: Negative.   Allergic/Immunologic: Negative.   Neurological: Negative.   Hematological: Negative.   Psychiatric/Behavioral: Negative.       Blood pressure 144/96, pulse 80, temperature 98.5 F (36.9 C), temperature source Oral, resp. rate 14, height 5\' 3"  (1.6 m), weight 187 lb (84.823 kg).   Physical Exam Physical Exam  Constitutional: She is oriented to person, place, and time. She appears well-developed and well-nourished.  HENT:   Head: Normocephalic and atraumatic.  Eyes: Conjunctivae and EOM are normal. Pupils are equal, round, and reactive to light.  Neck: Normal range of motion. Neck supple.  There is a palpable nodule in the left lobe of the thyroid gland that measures about 3 cm roughly. There is no palpable nodule of the right thyroid lobe. There is no palpable lymphadenopathy in the neck. There is no obvious hoarseness.  Cardiovascular: Normal rate, regular rhythm and normal heart sounds.   Pulmonary/Chest: Effort normal and breath sounds normal.  Abdominal: Soft. Bowel sounds are normal.  Musculoskeletal: Normal range of motion.  Lymphadenopathy:    She has no cervical adenopathy.  Neurological: She is alert and oriented to person, place, and time.  Skin: Skin is warm and dry.  Psychiatric: She has a normal mood and affect. Her behavior is normal.      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 Because we cannot distinguish benign for malignant on fine needle biopsy I think that she will require a left thyroid lobectomy. We will examine the right side at the time of the surgery and if the right side is abnormal on the right side may need to be removed as well. I've discussed with her in detail the risks and benefits of the operation to remove the  thyroid gland as well as some of the technical aspects including the risk of injury to the recurrent laryngeal nerve and parathyroid glands and she understands and wishes to proceed      Plan    Plan for left thyroid lobectomy and possible total thyroidectomy

## 2013-08-26 NOTE — Anesthesia Preprocedure Evaluation (Addendum)
Anesthesia Evaluation  Patient identified by MRN, date of birth, ID band Patient awake    Reviewed: Allergy & Precautions, H&P , NPO status , Patient's Chart, lab work & pertinent test results  History of Anesthesia Complications Negative for: history of anesthetic complications  Airway Mallampati: II TM Distance: >3 FB Neck ROM: Full    Dental  (+) Teeth Intact, Dental Advisory Given   Pulmonary neg pulmonary ROS,  breath sounds clear to auscultation        Cardiovascular negative cardio ROS  Rhythm:Regular Rate:Normal     Neuro/Psych negative neurological ROS  negative psych ROS   GI/Hepatic Neg liver ROS, GERD-  Medicated and Controlled,  Endo/Other  Morbid obesity  Renal/GU negative Renal ROS     Musculoskeletal   Abdominal   Peds  Hematology   Anesthesia Other Findings   Reproductive/Obstetrics negative OB ROS                          Anesthesia Physical Anesthesia Plan  ASA: II  Anesthesia Plan: General   Post-op Pain Management:    Induction: Intravenous  Airway Management Planned: Oral ETT  Additional Equipment:   Intra-op Plan:   Post-operative Plan: Extubation in OR  Informed Consent: I have reviewed the patients History and Physical, chart, labs and discussed the procedure including the risks, benefits and alternatives for the proposed anesthesia with the patient or authorized representative who has indicated his/her understanding and acceptance.   Dental advisory given  Plan Discussed with: CRNA and Surgeon  Anesthesia Plan Comments:         Anesthesia Quick Evaluation

## 2013-08-26 NOTE — Anesthesia Postprocedure Evaluation (Signed)
  Anesthesia Post-op Note  Patient: Kimberly Sparks  Procedure(s) Performed: Procedure(s): LEFT THYROID LOBECTOMY (Left)  Patient Location: PACU  Anesthesia Type:General  Level of Consciousness: awake and alert   Airway and Oxygen Therapy: Patient Spontanous Breathing  Post-op Pain: mild  Post-op Assessment: Post-op Vital signs reviewed  Post-op Vital Signs: stable  Last Vitals:  Filed Vitals:   08/26/13 1138  BP:   Pulse: 78  Temp: 36.5 C  Resp: 15    Complications: No apparent anesthesia complications

## 2013-08-26 NOTE — Interval H&P Note (Signed)
History and Physical Interval Note:  08/26/2013 8:33 AM  Kimberly Sparks  has presented today for surgery, with the diagnosis of left thyroid nodule  The various methods of treatment have been discussed with the patient and family. After consideration of risks, benefits and other options for treatment, the patient has consented to  Procedure(s): LEFT THYROID LOBECTOMY, POSSIBLE TOTAL (Left) as a surgical intervention .  The patient's history has been reviewed, patient examined, no change in status, stable for surgery.  I have reviewed the patient's chart and labs.  Questions were answered to the patient's satisfaction.     Luella Cook III

## 2013-08-27 LAB — BASIC METABOLIC PANEL
BUN: 10 mg/dL (ref 6–23)
CHLORIDE: 107 meq/L (ref 96–112)
CO2: 21 meq/L (ref 19–32)
Calcium: 9.5 mg/dL (ref 8.4–10.5)
Creatinine, Ser: 0.6 mg/dL (ref 0.50–1.10)
GFR calc non Af Amer: >90 mL/min (ref >90)
Glucose, Bld: 119 mg/dL — ABNORMAL HIGH (ref 70–99)
POTASSIUM: 4.3 meq/L (ref 3.7–5.3)
Sodium: 142 mEq/L (ref 137–147)

## 2013-08-27 MED ORDER — OXYCODONE-ACETAMINOPHEN 5-325 MG PO TABS: 1.0000 | ORAL_TABLET | ORAL | Status: DC | PRN

## 2013-08-27 NOTE — Progress Notes (Signed)
Discharge home. Home discharge instruction given, no questions verbalized. 

## 2013-08-27 NOTE — Progress Notes (Signed)
1 Day Post-Op  Subjective: No complaints. Tolerating diet  Objective: Vital signs in last 24 hours: Temp:  [97.2 F (36.2 C)-98.3 F (36.8 C)] 98.3 F (36.8 C) (04/22 0938) Pulse Rate:  [63-93] 75 (04/22 0938) Resp:  [14-18] 18 (04/22 0938) BP: (114-136)/(61-81) 136/81 mmHg (04/22 0938) SpO2:  [95 %-100 %] 100 % (04/22 0938) Weight:  [185 lb (83.915 kg)] 185 lb (83.915 kg) (04/21 1156) Last BM Date: 08/26/13  Intake/Output from previous day: 04/21 0701 - 04/22 0700 In: 2310 [P.O.:290; I.V.:2020] Out: 3250 [Urine:3250] Intake/Output this shift:    Neck: soft, incision looks good. voice sounds strong Resp: clear to auscultation bilaterally Cardio: regular rate and rhythm  Lab Results:   Recent Labs  08/26/13 0657  WBC 4.7  HGB 13.8  HCT 40.6  PLT 244   BMET  Recent Labs  08/26/13 0657 08/27/13 0345  NA 144 142  K 4.2 4.3  CL 107 107  CO2 23 21  GLUCOSE 97 119*  BUN 19 10  CREATININE 0.83 0.60  CALCIUM 9.3 9.5   PT/INR No results found for this basename: LABPROT, INR,  in the last 72 hours ABG No results found for this basename: PHART, PCO2, PO2, HCO3,  in the last 72 hours  Studies/Results: No results found.  Anti-infectives: Anti-infectives   Start     Dose/Rate Route Frequency Ordered Stop   08/26/13 0915  ceFAZolin (ANCEF) IVPB 2 g/50 mL premix     2 g 100 mL/hr over 30 Minutes Intravenous  Once 08/26/13 0914 08/26/13 0905      Assessment/Plan: s/p Procedure(s): LEFT THYROID LOBECTOMY (Left) Advance diet Discharge  LOS: 1 day    Luella Cook III 08/27/2013

## 2013-08-27 NOTE — Discharge Summary (Signed)
Physician Discharge Summary  Patient ID: Kimberly Sparks MRN: [id number] DOB/AGE: [birth year] 58 y.o.  Admit date: 08/26/2013 Discharge date: 08/27/2013  Admission Diagnoses:  Discharge Diagnoses:  Active Problems:   Thyroid nodule   Discharged Condition: good  Hospital Course: the pt underwent left thyroid lobectomy. She tolerated surgery well. On pod 1 she was ready for discharge home  Consults: None  Significant Diagnostic Studies: none  Treatments: surgery: as above  Discharge Exam: Blood pressure 136/81, pulse 75, temperature 98.3 F (36.8 C), temperature source Oral, resp. rate 18, height 5\' 3"  (1.6 m), weight 185 lb (83.915 kg), SpO2 100.00%. Neck: soft, incision looks good. voice sounds strong  Disposition: 01-Home or Self Care  Discharge Orders   Future Appointments Provider Department Dept Phone   09/01/2013 9:40 AM Merrie Roof, MD [facility], [place] [phone]   Future Orders Complete By Expires   Call MD for:  difficulty breathing, headache or visual disturbances  As directed    Call MD for:  extreme fatigue  As directed    Call MD for:  hives  As directed    Call MD for:  persistant dizziness or light-headedness  As directed    Call MD for:  persistant nausea and vomiting  As directed    Call MD for:  redness, tenderness, or signs of infection (pain, swelling, redness, odor or green/yellow discharge around incision site)  As directed    Call MD for:  severe uncontrolled pain  As directed    Call MD for:  temperature >100.4  As directed    Diet - low sodium heart healthy  As directed    Discharge instructions  As directed    Increase activity slowly  As directed    No wound care  As directed        Medication List         aspirin EC 81 MG tablet  Take 81 mg by mouth daily.     atorvastatin 10 MG tablet  Commonly known as:  LIPITOR  Take 10 mg by mouth at bedtime.     calcium-vitamin D 500-200 MG-UNIT per tablet  Commonly known  as:  OSCAL WITH D  Take 1 tablet by mouth daily.     cetirizine 10 MG tablet  Commonly known as:  ZYRTEC  Take 10 mg by mouth at bedtime.     ibuprofen 200 MG tablet  Commonly known as:  ADVIL,MOTRIN  Take 600 mg by mouth every 6 (six) hours as needed (Pain).     omeprazole 20 MG capsule  Commonly known as:  PRILOSEC  Take 20 mg by mouth at bedtime.     oxyCODONE-acetaminophen 5-325 MG per tablet  Commonly known as:  ROXICET  Take 1-2 tablets by mouth every 4 (four) hours as needed.           Follow-up Information   Follow up with Merrie Roof, MD In 2 weeks.   Specialty:  General Surgery   Contact information:   [address] [place] [address] [phone]       Follow up with Merrie Roof, MD In 2 weeks.   Specialty:  General Surgery   Contact information:   [address] [place] [address] [phone]       Signed: Luella Cook III 08/27/2013, 11:21 AM

## 2013-08-28 ENCOUNTER — Other Ambulatory Visit (INDEPENDENT_AMBULATORY_CARE_PROVIDER_SITE_OTHER): Payer: Self-pay

## 2013-08-28 ENCOUNTER — Ambulatory Visit (HOSPITAL_COMMUNITY)
Admission: RE | Admit: 2013-08-28 | Discharge: 2013-08-28 | Disposition: A | Discharge status: Home / Self Care | Payer: BC Managed Care – PPO | Source: Ambulatory Visit | Attending: General Surgery | Admitting: General Surgery

## 2013-08-28 ENCOUNTER — Telehealth (INDEPENDENT_AMBULATORY_CARE_PROVIDER_SITE_OTHER): Payer: Self-pay

## 2013-08-28 ENCOUNTER — Encounter (HOSPITAL_COMMUNITY): Payer: Self-pay | Admitting: General Surgery

## 2013-08-28 DIAGNOSIS — M79609 Pain in unspecified limb: Secondary | ICD-10-CM | POA: Insufficient documentation

## 2013-08-28 DIAGNOSIS — O2230 Deep phlebothrombosis in pregnancy, unspecified trimester: Secondary | ICD-10-CM

## 2013-08-28 NOTE — Progress Notes (Signed)
*  Preliminary Results* Bilateral lower extremity venous duplex completed. Bilateral lower extremities are negative for deep vein thrombosis. There is no evidence of Baker's cyst bilaterally.  08/28/2013  Maudry Mayhew, RVT, RDCS, RDMS

## 2013-08-28 NOTE — Telephone Encounter (Signed)
Patient calling into office to report that she's having bilateral leg pain and right leg swelling.  Patient states this started last night.  Patient s/p Total Thyroidectomy on 08/26/13.  Dr. Marlou Starks not available, reviewed with Dr. Grandville Silos and order for Venous Doppler ordered to rule out DVT.  Patient advised to go to [facility] Registration, then Vascular Lab will administer test and call our office with results.

## 2013-08-29 NOTE — Telephone Encounter (Signed)
Called and spoke to patient to make aware her Venous Duplex was Negative for DVT.  Patient states she's feeling better today.  She states she felt maybe she was doing too much too soon.  Patient scheduled post op appointment on 09/12/13 @ 3:50 pm w/Dr. Marlou Starks

## 2013-08-29 NOTE — Progress Notes (Signed)
Called patient his am to give her Negative results of study.  Patient reports she's feeling much better today.

## 2013-09-01 ENCOUNTER — Encounter (INDEPENDENT_AMBULATORY_CARE_PROVIDER_SITE_OTHER): Payer: BC Managed Care – PPO | Admitting: General Surgery

## 2013-09-12 ENCOUNTER — Encounter (INDEPENDENT_AMBULATORY_CARE_PROVIDER_SITE_OTHER): Payer: Self-pay | Admitting: General Surgery

## 2013-09-12 ENCOUNTER — Ambulatory Visit (INDEPENDENT_AMBULATORY_CARE_PROVIDER_SITE_OTHER): Payer: BC Managed Care – PPO | Admitting: General Surgery

## 2013-09-12 VITALS — BP 128/86 | HR 76 | Resp 14 | Ht 63.0 in | Wt 193.8 lb

## 2013-09-12 DIAGNOSIS — E041 Nontoxic single thyroid nodule: Secondary | ICD-10-CM

## 2013-09-12 NOTE — Progress Notes (Signed)
Subjective:     Patient ID: Kimberly Sparks, female   DOB: [birth year], 58 y.o.   MRN: [id number]  HPI The patient is a 58 -year-old white female who is about 2 weeks status post left thyroid lobectomy for a benign adenomatous nodule. Her voice is strong. Her only complaint is of some occasional cramping in her neck  Review of Systems     Objective:   Physical Exam On exam her neck incision is healing nicely with no sign of infection. Her neck is soft. Her voice is strong.    Assessment:     The patient is 2 weeks Status post left thyroid lobectomy for benign disease     Plan:     At this point I will plan to see her back in about one month at which time we will check her thyroid function studies. She may return to normal activities as she tolerates.

## 2013-10-07 ENCOUNTER — Encounter (INDEPENDENT_AMBULATORY_CARE_PROVIDER_SITE_OTHER): Payer: BC Managed Care – PPO | Admitting: General Surgery

## 2014-04-26 IMAGING — CR DG CHEST 2V
2 series · 2 of 2 positions shown · non-contrast
Comparison: None.

CLINICAL DATA: Preop

EXAM:
CHEST  2 VIEW

[w chest pa]
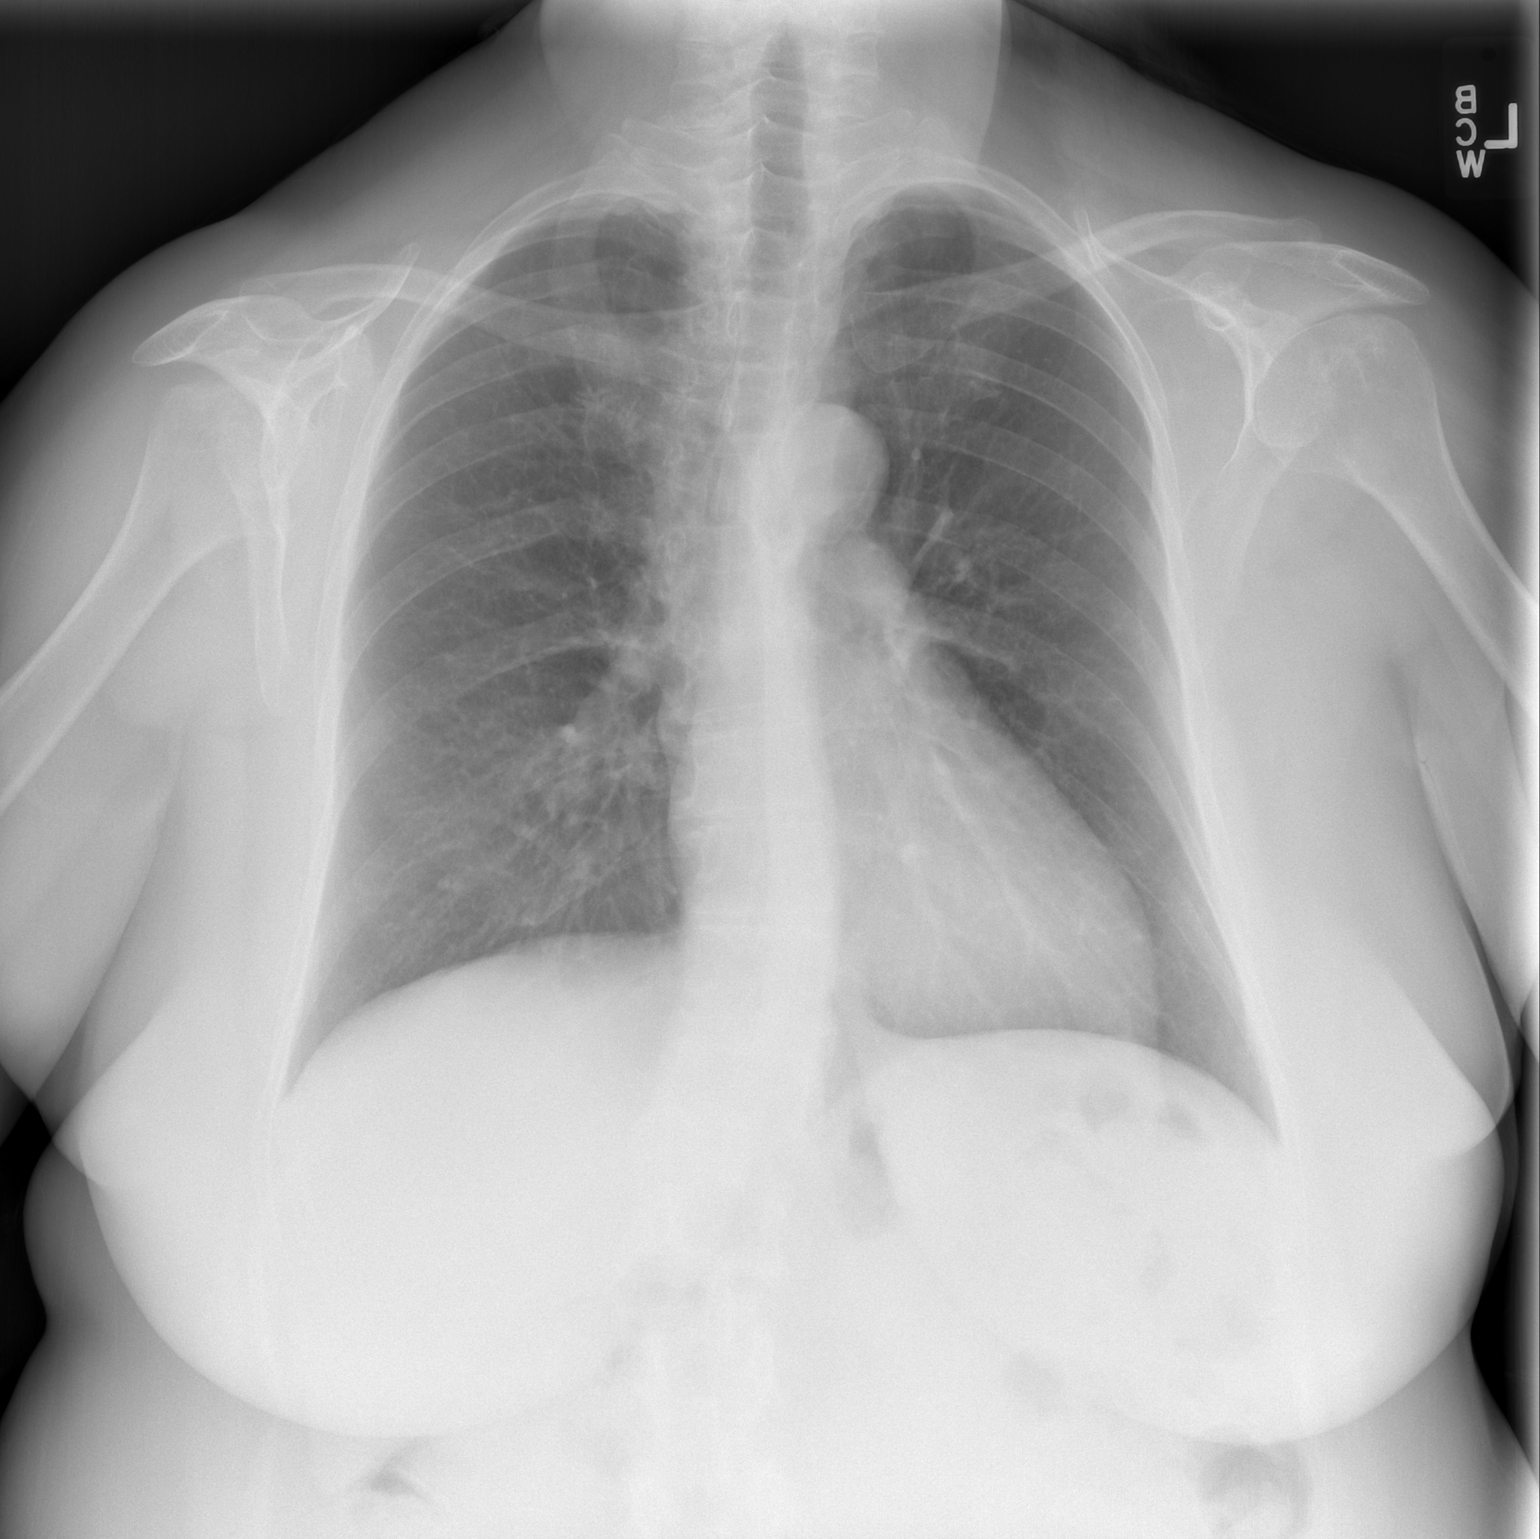

[w chest lat]
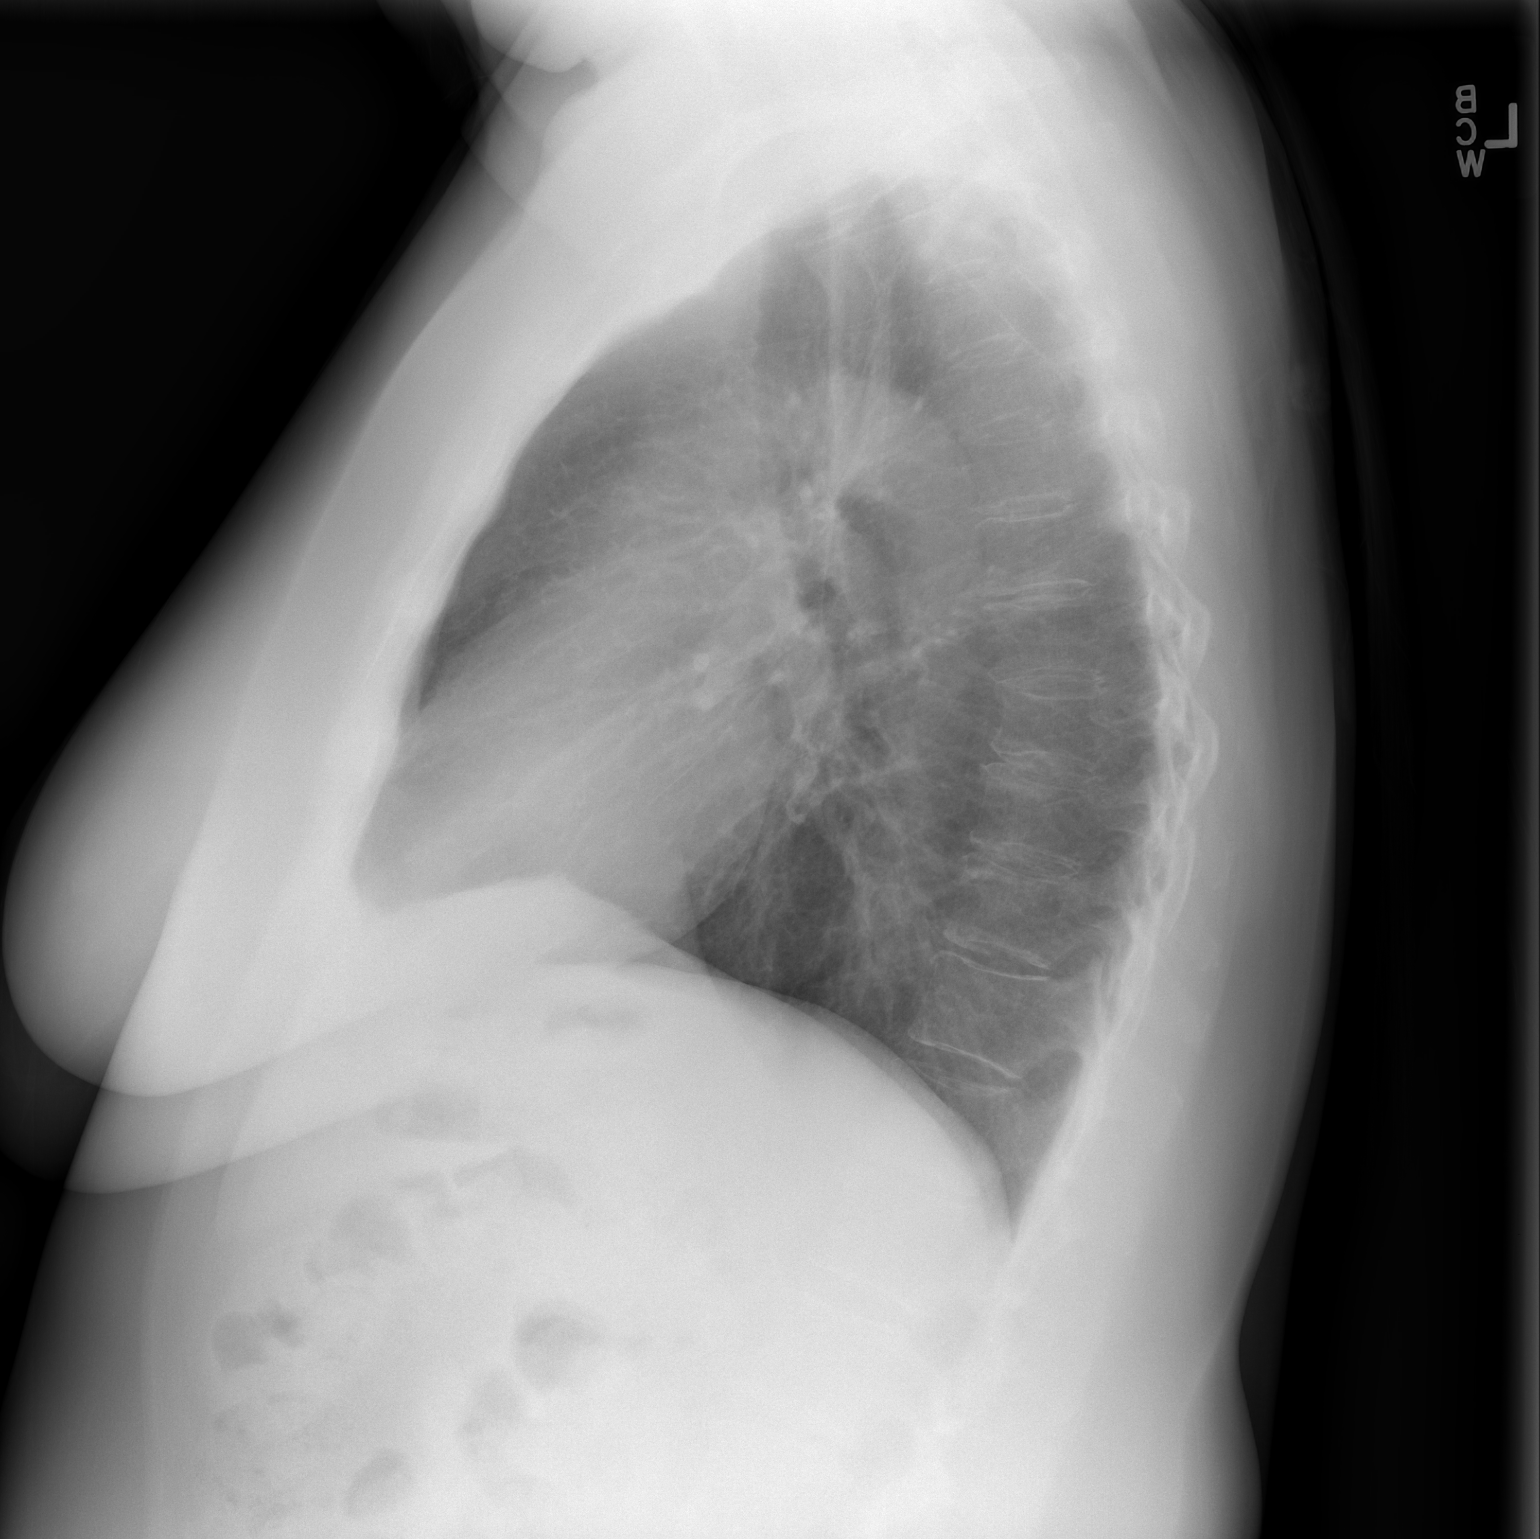

[2 of 2 positions shown; findings below may reference images not displayed]

FINDINGS: Cardiomediastinal silhouette is unremarkable. Mild S-shaped thoracic
scoliosis. Mild degenerative changes thoracic spine. No acute
infiltrate or pleural effusion. No pulmonary edema.
IMPRESSION: No active cardiopulmonary disease.

## 2014-05-21 ENCOUNTER — Encounter (HOSPITAL_COMMUNITY): Payer: Self-pay | Admitting: General Surgery

## 2015-12-21 ENCOUNTER — Other Ambulatory Visit: Payer: Self-pay | Admitting: General Surgery

## 2015-12-21 DIAGNOSIS — R221 Localized swelling, mass and lump, neck: Secondary | ICD-10-CM

## 2015-12-27 ENCOUNTER — Ambulatory Visit
Admission: RE | Admit: 2015-12-27 | Discharge: 2015-12-27 | Disposition: A | Discharge status: Home / Self Care | Payer: BLUE CROSS/BLUE SHIELD | Source: Ambulatory Visit | Attending: General Surgery | Admitting: General Surgery

## 2015-12-27 DIAGNOSIS — R221 Localized swelling, mass and lump, neck: Secondary | ICD-10-CM

## 2016-09-17 IMAGING — US US THYROID
1 series · 14 of 25 positions shown · non-contrast
Comparison: None.

CLINICAL DATA: Left thyroidectomy. Right neck mass. Left
thyroidectomy.

EXAM:
THYROID ULTRASOUND
TECHNIQUE: Ultrasound examination of the thyroid gland and adjacent soft
tissues was performed.

[Series 1: us thyroid · 0.07mm/px · 14 of 42 slices shown]
[im 1/42]
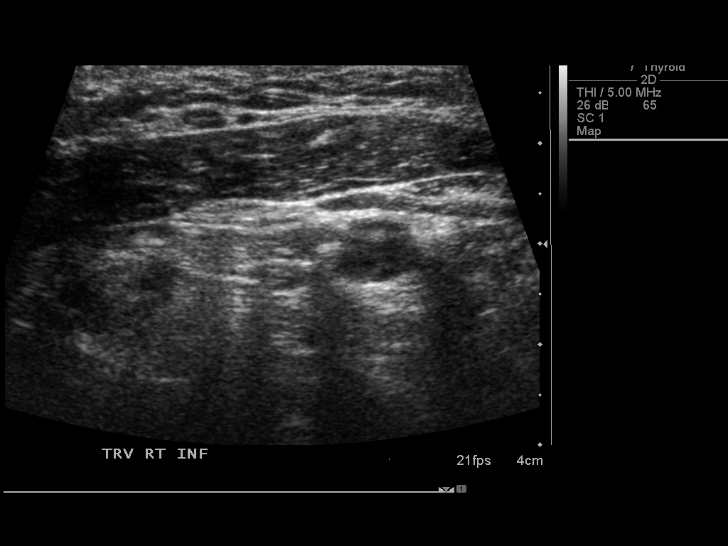
[im 4/42]
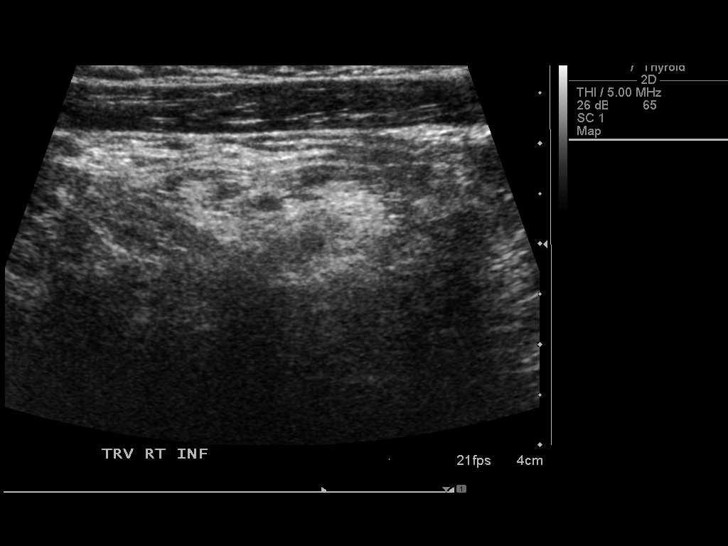
[im 7/42]
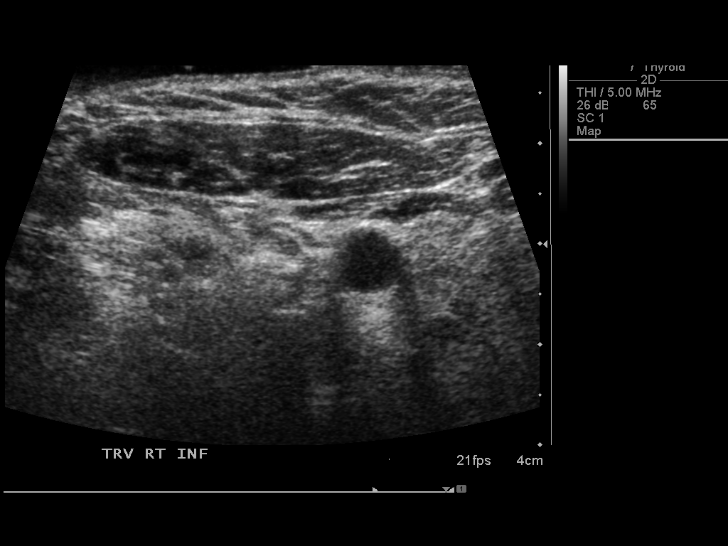
[im 11/42]
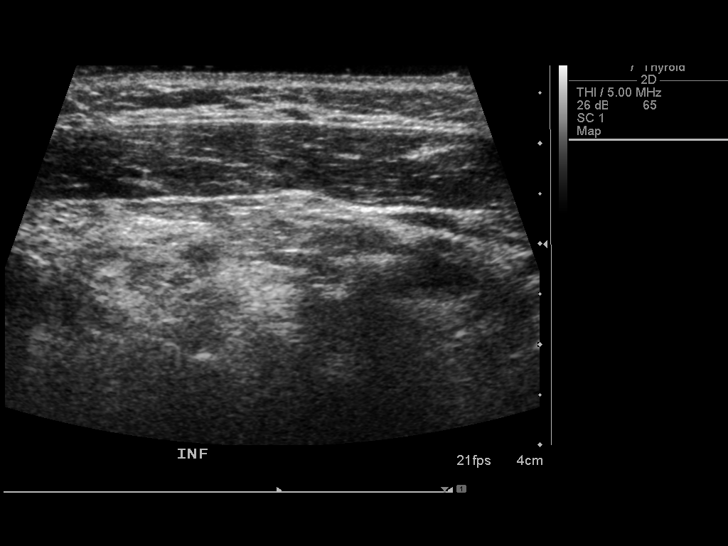
[im 14/42]
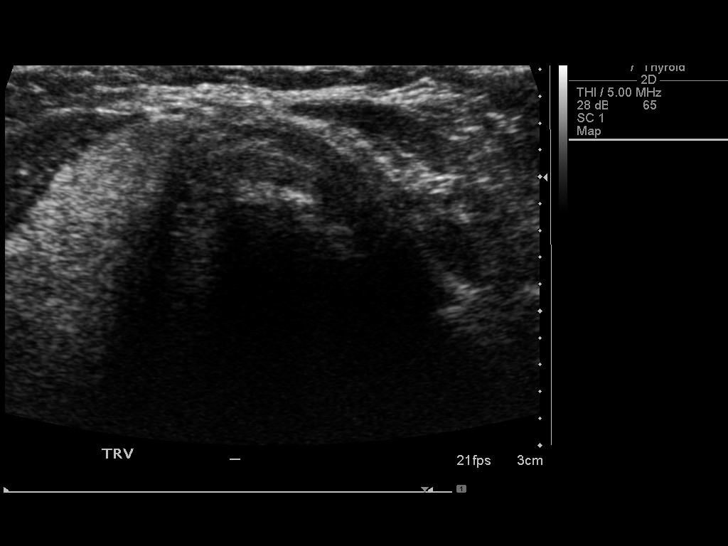
[im 16/42]
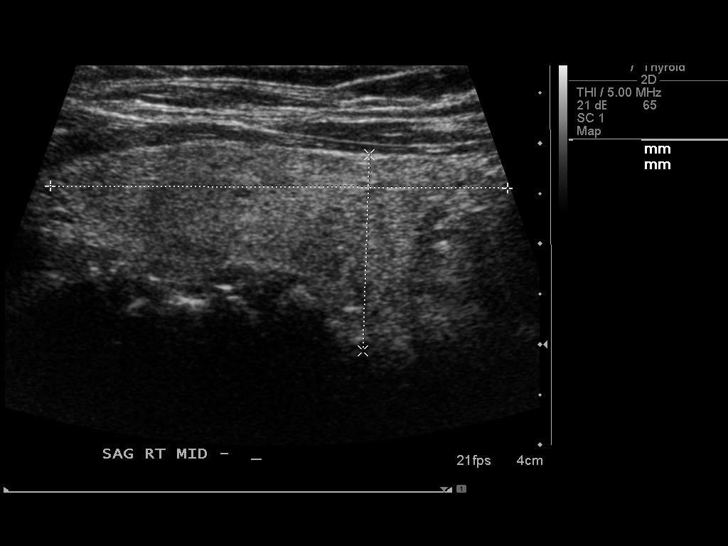
[im 19/42]
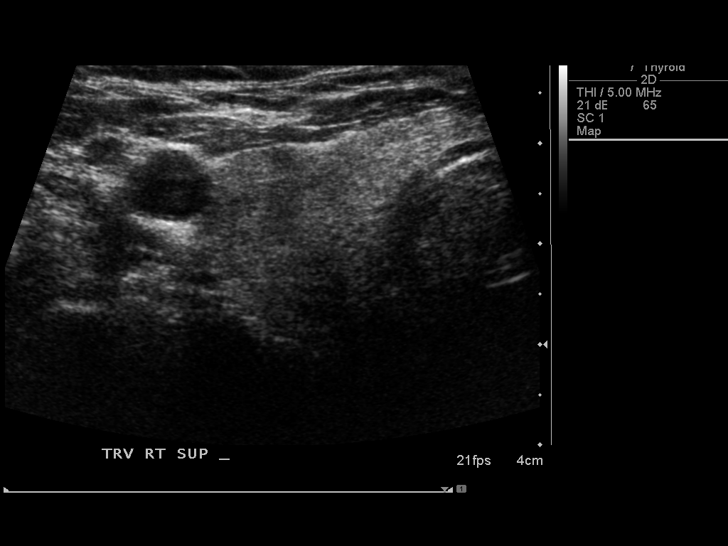
[im 23/42]
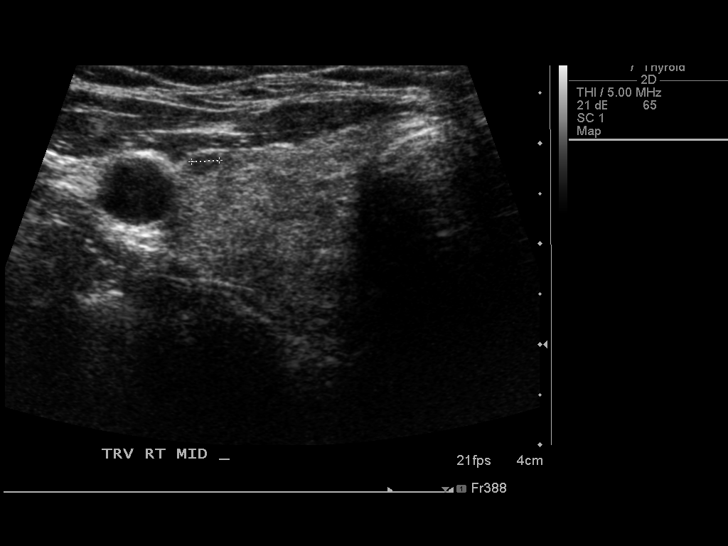
[im 26/42]
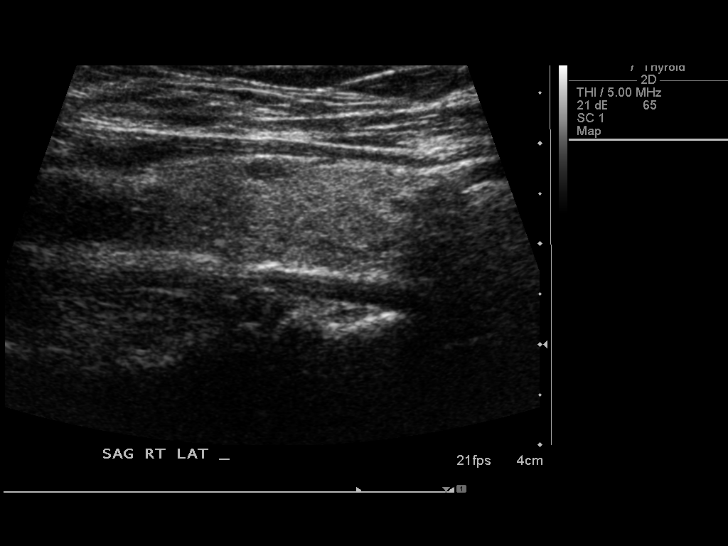
[im 28/42]
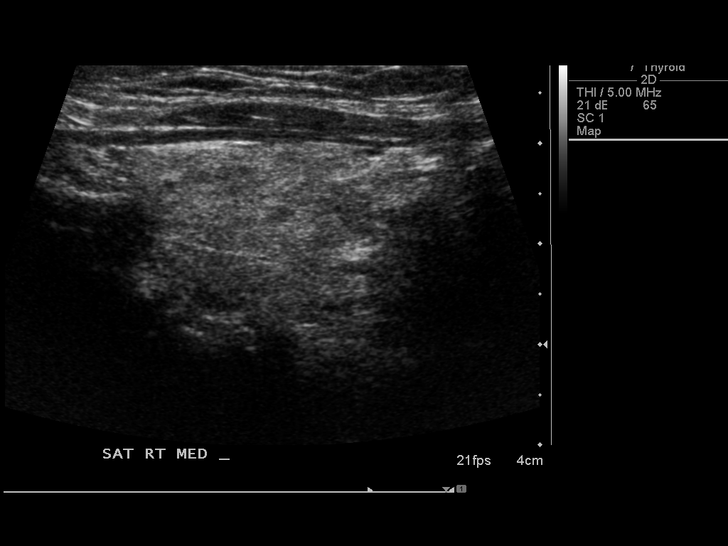
[im 31/42]
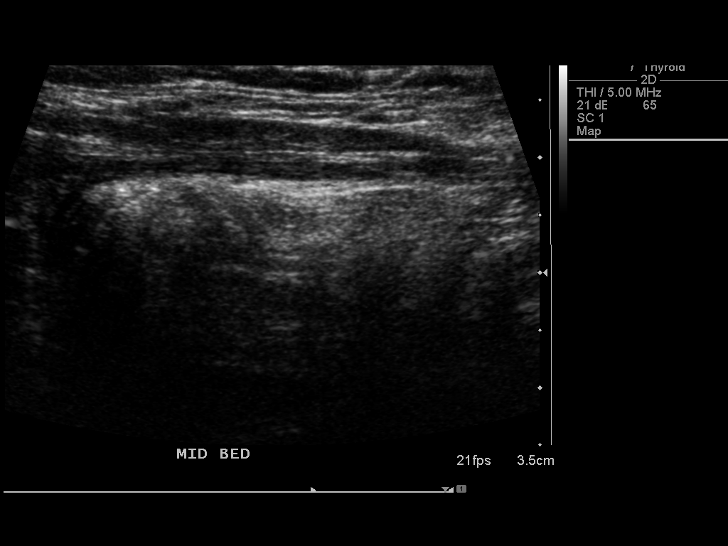
[im 35/42]
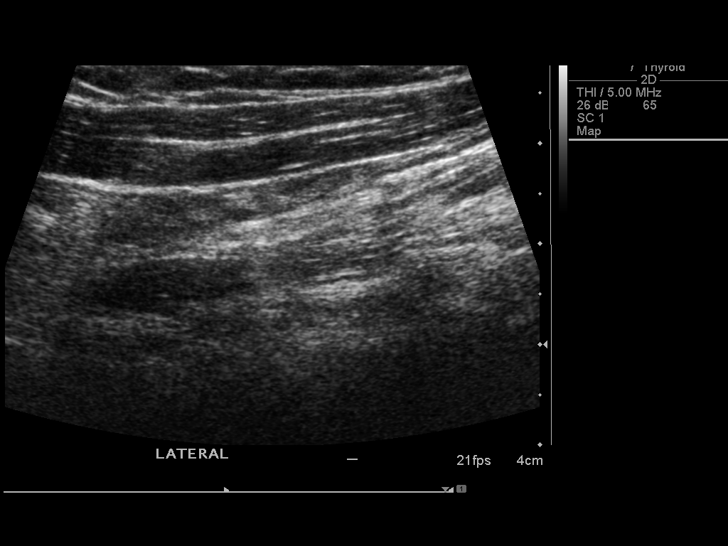
[im 38/42]
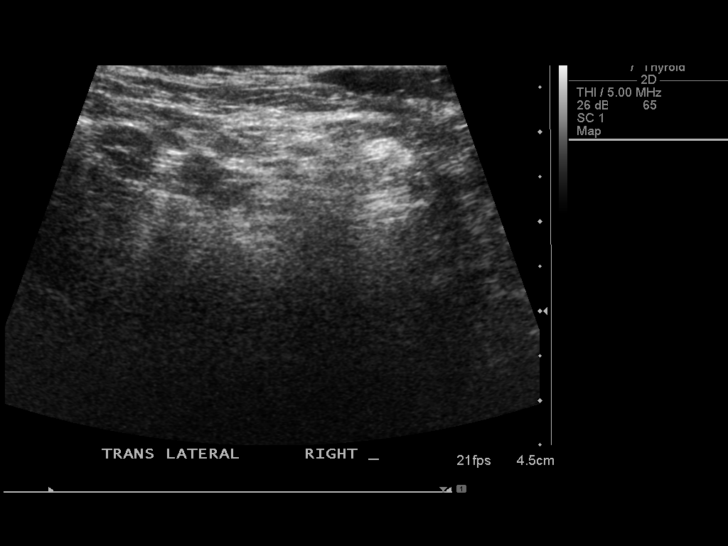
[im 42/42]
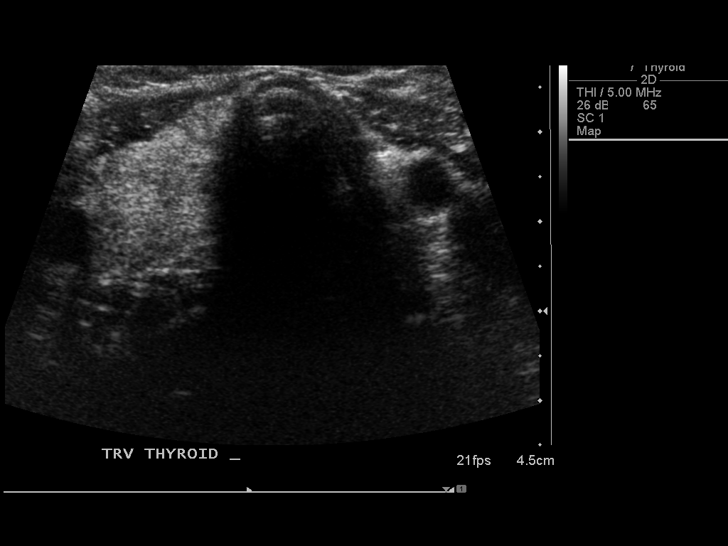

[14 of 25 positions shown; findings below may reference images not displayed]

FINDINGS: Right thyroid lobe

Measurements: 4.5 x 2.0 x 1.9 cm. 0.3 cm upper pole nodule. 0.3 cm
mid lobe nodule.

Left thyroid lobe

No residual tissue.

Isthmus

No residual tissue.

Lymphadenopathy

None visualized.
IMPRESSION: No residual tissue in the left thyroid bed or region of the isthmus
to suggest recurrence

Very small 0.3 cm nodules in the right lobe. Findings do not meet
current SRU consensus criteria for biopsy. Follow-up by clinical
exam is recommended. If patient has known risk factors for thyroid
carcinoma, consider follow-up ultrasound in 12 months. If patient is
clinically hyperthyroid, consider nuclear medicine thyroid uptake
and scan.Reference: Management of Thyroid Nodules Detected at US:
Society of Radiologists in Ultrasound Consensus Conference

## 2019-07-16 ENCOUNTER — Ambulatory Visit: Payer: BLUE CROSS/BLUE SHIELD | Attending: Internal Medicine

## 2019-07-16 ENCOUNTER — Other Ambulatory Visit: Payer: Self-pay

## 2019-07-16 DIAGNOSIS — Z20822 Contact with and (suspected) exposure to covid-19: Secondary | ICD-10-CM

## 2019-07-17 LAB — NOVEL CORONAVIRUS, NAA: SARS-CoV-2, NAA: NOT DETECTED

## 2021-10-26 ENCOUNTER — Encounter (INDEPENDENT_AMBULATORY_CARE_PROVIDER_SITE_OTHER): Payer: Self-pay | Admitting: *Deleted

## 2021-12-26 ENCOUNTER — Ambulatory Visit (INDEPENDENT_AMBULATORY_CARE_PROVIDER_SITE_OTHER): Payer: BLUE CROSS/BLUE SHIELD | Admitting: Gastroenterology

## 2022-01-11 DIAGNOSIS — R5383 Other fatigue: Secondary | ICD-10-CM | POA: Diagnosis not present

## 2022-01-11 DIAGNOSIS — E890 Postprocedural hypothyroidism: Secondary | ICD-10-CM | POA: Diagnosis not present

## 2022-01-11 DIAGNOSIS — K219 Gastro-esophageal reflux disease without esophagitis: Secondary | ICD-10-CM | POA: Diagnosis not present

## 2022-01-11 DIAGNOSIS — E7849 Other hyperlipidemia: Secondary | ICD-10-CM | POA: Diagnosis not present

## 2022-01-11 DIAGNOSIS — E559 Vitamin D deficiency, unspecified: Secondary | ICD-10-CM | POA: Diagnosis not present

## 2022-01-13 DIAGNOSIS — Z1231 Encounter for screening mammogram for malignant neoplasm of breast: Secondary | ICD-10-CM | POA: Diagnosis not present

## 2022-01-16 ENCOUNTER — Encounter (INDEPENDENT_AMBULATORY_CARE_PROVIDER_SITE_OTHER): Payer: Self-pay | Admitting: Gastroenterology

## 2022-01-16 ENCOUNTER — Encounter (INDEPENDENT_AMBULATORY_CARE_PROVIDER_SITE_OTHER): Payer: Self-pay

## 2022-01-16 ENCOUNTER — Other Ambulatory Visit (INDEPENDENT_AMBULATORY_CARE_PROVIDER_SITE_OTHER): Payer: Self-pay

## 2022-01-16 ENCOUNTER — Ambulatory Visit (INDEPENDENT_AMBULATORY_CARE_PROVIDER_SITE_OTHER): Payer: Medicare Other | Admitting: Gastroenterology

## 2022-01-16 ENCOUNTER — Telehealth (INDEPENDENT_AMBULATORY_CARE_PROVIDER_SITE_OTHER): Payer: Self-pay

## 2022-01-16 DIAGNOSIS — R143 Flatulence: Secondary | ICD-10-CM

## 2022-01-16 MED ORDER — PEG 3350-KCL-NA BICARB-NACL 420 G PO SOLR: 4000.0000 mL | ORAL | 0 refills | Status: DC

## 2022-01-16 NOTE — Telephone Encounter (Signed)
Alando Colleran Ann Ashdon Gillson, CMA  ?

## 2022-01-16 NOTE — Patient Instructions (Signed)
Schedule colonoscopy Patient was counseled about the benefit of implementing a low FODMAP to improve symptoms and recurrent episodes. A dietary list was provided to the patient.

## 2022-01-16 NOTE — Progress Notes (Signed)
Maylon Peppers, M.D. Gastroenterology & Hepatology [facility] [address] [place], [place] [address] Primary Care Physician: Curlene Labrum, MD [place] [address]  Referring MD: PCP  Chief Complaint: Flatulence  History of Present Illness: Kimberly Sparks is a 66 y.o. female with past medical history of GERD, hyperlipidemia, who presents for evaluation of flatulence.  Patient reports that for the last 2 years she has noticed recurrent episodes of flatulence frequently. Denies having any foul smell but states it is frequent. She was advised to decrease the intake of oatmeal, but is still having some flatulence. She has "fairly normal Bms" as she has one bowel movement every day without diarrhea. The patient denies having any nausea, vomiting, fever, chills, hematochezia, melena, hematemesis, abdominal distention, abdominal pain, diarrhea, jaundice, pruritus or weight loss.  Last JAS:NKNLZ Last Colonoscopy:per patient performed at [facility] by Dr. Anthony Sar, she reports it was normal. No reports are available.  FHx: neg for any gastrointestinal/liver disease, grandmother uterine cancer Social: neg smoking, alcohol or illicit drug use Surgical: no abdominal surgeries  Past Medical History: Past Medical History:  Diagnosis Date   GERD (gastroesophageal reflux disease)    Hyperlipemia    Left thyroid nodule    PT FELT LUMP IN NECK - NO OTHER SYMPTOMS.   Seasonal allergies     Past Surgical History: Past Surgical History:  Procedure Laterality Date   dental graft  2014   THYROID LOBECTOMY Left 08/26/2013   DR Marlou Starks   THYROID LOBECTOMY Left 08/26/2013   Procedure: LEFT THYROID LOBECTOMY;  Surgeon: Merrie Roof, MD;  Location: [facility];  Service: General;  Laterality: Left;   WISDOM TOOTH EXTRACTION  1984    Family History: Family History  Problem Relation Age of Onset   Stroke Mother    Heart disease Mother     Hypertension Father    Diabetes Father     Social History: Social History   Tobacco Use  Smoking Status Never   Passive exposure: Past  Smokeless Tobacco Never  Tobacco Comments   Exposure over 30 year ago   Social History   Substance and Sexual Activity  Alcohol Use No   Social History   Substance and Sexual Activity  Drug Use No    Allergies: Allergies  Allergen Reactions   Sulfa Antibiotics Itching and Rash    swelling     Medications: Current Outpatient Medications  Medication Sig Dispense Refill   aspirin EC 81 MG tablet Take 81 mg by mouth daily.     atorvastatin (LIPITOR) 20 MG tablet Take 20 mg by mouth at bedtime.     calcium-vitamin D (OSCAL WITH D) 500-200 MG-UNIT per tablet Take 1 tablet by mouth daily.     cetirizine (ZYRTEC) 10 MG tablet Take 10 mg by mouth at bedtime.      ibuprofen (ADVIL,MOTRIN) 200 MG tablet Take 600 mg by mouth every 6 (six) hours as needed (Pain).     omeprazole (PRILOSEC) 20 MG capsule Take 20 mg by mouth at bedtime.      No current facility-administered medications for this visit.    Review of Systems: GENERAL: negative for malaise, night sweats HEENT: No changes in hearing or vision, no nose bleeds or other nasal problems. NECK: Negative for lumps, goiter, pain and significant neck swelling RESPIRATORY: Negative for cough, wheezing CARDIOVASCULAR: Negative for chest pain, leg swelling, palpitations, orthopnea GI: SEE HPI MUSCULOSKELETAL: Negative for joint pain or  swelling, back pain, and muscle pain. SKIN: Negative for lesions, rash PSYCH: Negative for sleep disturbance, mood disorder and recent psychosocial stressors. HEMATOLOGY Negative for prolonged bleeding, bruising easily, and swollen nodes. ENDOCRINE: Negative for cold or heat intolerance, polyuria, polydipsia and goiter. NEURO: negative for tremor, gait imbalance, syncope and seizures. The remainder of the review of systems is noncontributory.   Physical  Exam: BP 117/82 (BP Location: Left Arm, Patient Position: Sitting, Cuff Size: Large)   Pulse 75   Temp 98 F (36.7 C) (Oral)   Ht '5\' 3"'$  (1.6 m)   Wt 180 lb (81.6 kg)   BMI 31.89 kg/m  GENERAL: The patient is AO x3, in no acute distress. HEENT: Head is normocephalic and atraumatic. EOMI are intact. Mouth is well hydrated and without lesions. NECK: Supple. No masses LUNGS: Clear to auscultation. No presence of rhonchi/wheezing/rales. Adequate chest expansion HEART: RRR, normal s1 and s2. ABDOMEN: Soft, nontender, no guarding, no peritoneal signs, and nondistended. BS +. No masses. EXTREMITIES: Without any cyanosis, clubbing, rash, lesions or edema. NEUROLOGIC: AOx3, no focal motor deficit. SKIN: no jaundice, no rashes   Imaging/Labs: as above  I personally reviewed and interpreted the available labs, imaging and endoscopic files.  Impression and Plan: Kimberly Sparks is a 66 y.o. female with past medical history of GERD, hyperlipidemia, who presents for evaluation of flatulence.  The patient has presented increased flatulence without any any other associated symptoms or red flag signs.  However, she is concerned about this change and would like to proceed with a colonoscopy which I consider is reasonable.  We will schedule her for colonoscopy.  I advised her to try to implement a low FODMAP diet for a month and reintroduce her food slowly and keeping a food journal.  - Schedule colonoscopy - Patient was counseled about the benefit of implementing a low FODMAP to improve symptoms and recurrent episodes. A dietary list was provided to the patient.  All questions were answered.      Maylon Peppers, MD Gastroenterology and Hepatology [facility]

## 2022-01-18 DIAGNOSIS — J309 Allergic rhinitis, unspecified: Secondary | ICD-10-CM | POA: Diagnosis not present

## 2022-01-18 DIAGNOSIS — R030 Elevated blood-pressure reading, without diagnosis of hypertension: Secondary | ICD-10-CM | POA: Diagnosis not present

## 2022-01-18 DIAGNOSIS — E782 Mixed hyperlipidemia: Secondary | ICD-10-CM | POA: Diagnosis not present

## 2022-01-18 DIAGNOSIS — Z0001 Encounter for general adult medical examination with abnormal findings: Secondary | ICD-10-CM | POA: Diagnosis not present

## 2022-01-18 DIAGNOSIS — E890 Postprocedural hypothyroidism: Secondary | ICD-10-CM | POA: Diagnosis not present

## 2022-01-18 DIAGNOSIS — E7849 Other hyperlipidemia: Secondary | ICD-10-CM | POA: Diagnosis not present

## 2022-01-18 DIAGNOSIS — R143 Flatulence: Secondary | ICD-10-CM | POA: Diagnosis not present

## 2022-01-18 DIAGNOSIS — K219 Gastro-esophageal reflux disease without esophagitis: Secondary | ICD-10-CM | POA: Diagnosis not present

## 2022-01-18 DIAGNOSIS — Z833 Family history of diabetes mellitus: Secondary | ICD-10-CM | POA: Diagnosis not present

## 2022-02-13 ENCOUNTER — Encounter (INDEPENDENT_AMBULATORY_CARE_PROVIDER_SITE_OTHER): Payer: Self-pay

## 2022-02-13 ENCOUNTER — Telehealth (INDEPENDENT_AMBULATORY_CARE_PROVIDER_SITE_OTHER): Payer: Self-pay

## 2022-02-13 MED ORDER — CLENPIQ 10-3.5-12 MG-GM -GM/160ML PO SOLN: 1.0000 | Freq: Once | ORAL | 0 refills | Status: AC

## 2022-02-13 NOTE — Telephone Encounter (Signed)
Jailen Coward Ann Luiz Trumpower, CMA  ?

## 2022-02-17 ENCOUNTER — Other Ambulatory Visit (HOSPITAL_COMMUNITY): Payer: BLUE CROSS/BLUE SHIELD

## 2022-02-24 ENCOUNTER — Telehealth (INDEPENDENT_AMBULATORY_CARE_PROVIDER_SITE_OTHER): Payer: Self-pay

## 2022-02-24 MED ORDER — CLENPIQ 10-3.5-12 MG-GM -GM/160ML PO SOLN: 1.0000 | Freq: Once | ORAL | 0 refills | Status: AC

## 2022-02-24 NOTE — Telephone Encounter (Signed)
Neela Zecca Ann Ikeisha Blumberg, CMA  ?

## 2022-02-27 ENCOUNTER — Other Ambulatory Visit (INDEPENDENT_AMBULATORY_CARE_PROVIDER_SITE_OTHER): Payer: Self-pay

## 2022-03-03 ENCOUNTER — Ambulatory Visit (HOSPITAL_BASED_OUTPATIENT_CLINIC_OR_DEPARTMENT_OTHER): Payer: Medicare Other | Admitting: Anesthesiology

## 2022-03-03 ENCOUNTER — Encounter (HOSPITAL_COMMUNITY): Payer: Self-pay | Admitting: Gastroenterology

## 2022-03-03 ENCOUNTER — Ambulatory Visit (HOSPITAL_COMMUNITY)
Admission: RE | Admit: 2022-03-03 | Discharge: 2022-03-03 | Disposition: A | Discharge status: Home / Self Care | Payer: Medicare Other | Source: Ambulatory Visit | Attending: Gastroenterology | Admitting: Gastroenterology

## 2022-03-03 ENCOUNTER — Encounter (HOSPITAL_COMMUNITY)
Admission: RE | Disposition: A | Discharge status: Home / Self Care | Payer: Self-pay | Source: Ambulatory Visit | Attending: Gastroenterology

## 2022-03-03 ENCOUNTER — Other Ambulatory Visit: Payer: Self-pay

## 2022-03-03 ENCOUNTER — Ambulatory Visit (HOSPITAL_COMMUNITY): Payer: Medicare Other | Admitting: Anesthesiology

## 2022-03-03 DIAGNOSIS — D120 Benign neoplasm of cecum: Secondary | ICD-10-CM | POA: Diagnosis not present

## 2022-03-03 DIAGNOSIS — K635 Polyp of colon: Secondary | ICD-10-CM

## 2022-03-03 DIAGNOSIS — K219 Gastro-esophageal reflux disease without esophagitis: Secondary | ICD-10-CM | POA: Insufficient documentation

## 2022-03-03 DIAGNOSIS — D123 Benign neoplasm of transverse colon: Secondary | ICD-10-CM | POA: Insufficient documentation

## 2022-03-03 DIAGNOSIS — Z7722 Contact with and (suspected) exposure to environmental tobacco smoke (acute) (chronic): Secondary | ICD-10-CM | POA: Insufficient documentation

## 2022-03-03 DIAGNOSIS — K5730 Diverticulosis of large intestine without perforation or abscess without bleeding: Secondary | ICD-10-CM | POA: Diagnosis not present

## 2022-03-03 DIAGNOSIS — K648 Other hemorrhoids: Secondary | ICD-10-CM

## 2022-03-03 DIAGNOSIS — Z1211 Encounter for screening for malignant neoplasm of colon: Secondary | ICD-10-CM

## 2022-03-03 DIAGNOSIS — D122 Benign neoplasm of ascending colon: Secondary | ICD-10-CM

## 2022-03-03 DIAGNOSIS — E785 Hyperlipidemia, unspecified: Secondary | ICD-10-CM | POA: Diagnosis not present

## 2022-03-03 HISTORY — PX: HEMOSTASIS CLIP PLACEMENT: SHX6857

## 2022-03-03 HISTORY — PX: COLONOSCOPY WITH PROPOFOL: SHX5780

## 2022-03-03 HISTORY — PX: POLYPECTOMY: SHX5525

## 2022-03-03 HISTORY — PX: SUBMUCOSAL LIFTING INJECTION: SHX6855

## 2022-03-03 LAB — HM COLONOSCOPY

## 2022-03-03 SURGERY — COLONOSCOPY WITH PROPOFOL
Anesthesia: General

## 2022-03-03 MED ORDER — PROPOFOL 500 MG/50ML IV EMUL
INTRAVENOUS | Status: AC
  Filled 2022-03-03: qty 50

## 2022-03-03 MED ORDER — STERILE WATER FOR IRRIGATION IR SOLN
Status: DC | PRN
  Administered 2022-03-03: 60 mL

## 2022-03-03 MED ORDER — LACTATED RINGERS IV SOLN: INTRAVENOUS | Status: DC

## 2022-03-03 MED ORDER — LIDOCAINE HCL (PF) 2 % IJ SOLN
INTRAMUSCULAR | Status: AC
  Filled 2022-03-03: qty 5

## 2022-03-03 MED ORDER — PHENYLEPHRINE HCL (PRESSORS) 10 MG/ML IV SOLN
INTRAVENOUS | Status: DC | PRN
  Administered 2022-03-03: 100 ug via INTRAVENOUS

## 2022-03-03 MED ORDER — PROPOFOL 500 MG/50ML IV EMUL
INTRAVENOUS | Status: DC | PRN
  Administered 2022-03-03: 150 ug/kg/min via INTRAVENOUS

## 2022-03-03 MED ORDER — LIDOCAINE HCL (CARDIAC) PF 100 MG/5ML IV SOSY
PREFILLED_SYRINGE | INTRAVENOUS | Status: DC | PRN
  Administered 2022-03-03: 50 mg via INTRAVENOUS

## 2022-03-03 NOTE — Anesthesia Postprocedure Evaluation (Signed)
Anesthesia Post Note  Patient: KYLIA GRAJALES  Procedure(s) Performed: COLONOSCOPY WITH PROPOFOL POLYPECTOMY SUBMUCOSAL LIFTING INJECTION HEMOSTASIS CLIP PLACEMENT  Patient location during evaluation: Phase II Anesthesia Type: General Level of consciousness: awake Pain management: pain level controlled Vital Signs Assessment: post-procedure vital signs reviewed and stable Respiratory status: spontaneous breathing and respiratory function stable Cardiovascular status: blood pressure returned to baseline and stable Postop Assessment: no headache and no apparent nausea or vomiting Anesthetic complications: no Comments: Late entry   There were no known notable events for this encounter.   Last Vitals:  Vitals:   03/03/22 0915 03/03/22 1112  BP: (!) 165/94 111/65  Pulse: 77 71  Resp: 20 (!) 21  Temp: 36.9 C (!) 36.4 C  SpO2: 99% 100%    Last Pain:  Vitals:   03/03/22 1112  TempSrc: Oral  PainSc: 0-No pain                 Louann Sjogren

## 2022-03-03 NOTE — Anesthesia Preprocedure Evaluation (Signed)
Anesthesia Evaluation  Patient identified by MRN, date of birth, ID band Patient awake    Reviewed: Allergy & Precautions, H&P , NPO status , Patient's Chart, lab work & pertinent test results, reviewed documented beta blocker date and time   Airway Mallampati: II  TM Distance: >3 FB Neck ROM: full    Dental no notable dental hx.    Pulmonary neg pulmonary ROS,    Pulmonary exam normal breath sounds clear to auscultation       Cardiovascular Exercise Tolerance: Good negative cardio ROS   Rhythm:regular Rate:Normal     Neuro/Psych negative neurological ROS  negative psych ROS   GI/Hepatic Neg liver ROS, GERD  Medicated,  Endo/Other  negative endocrine ROS  Renal/GU negative Renal ROS  negative genitourinary   Musculoskeletal   Abdominal   Peds  Hematology negative hematology ROS (+)   Anesthesia Other Findings   Reproductive/Obstetrics negative OB ROS                             Anesthesia Physical Anesthesia Plan  ASA: 2  Anesthesia Plan: General   Post-op Pain Management:    Induction:   PONV Risk Score and Plan: Propofol infusion  Airway Management Planned:   Additional Equipment:   Intra-op Plan:   Post-operative Plan:   Informed Consent: I have reviewed the patients History and Physical, chart, labs and discussed the procedure including the risks, benefits and alternatives for the proposed anesthesia with the patient or authorized representative who has indicated his/her understanding and acceptance.     Dental Advisory Given  Plan Discussed with: CRNA  Anesthesia Plan Comments:         Anesthesia Quick Evaluation  

## 2022-03-03 NOTE — Discharge Instructions (Signed)
You are being discharged to home.  Resume your previous diet.  We are waiting for your pathology results.  Your physician has recommended a repeat colonoscopy for surveillance based on pathology results.  

## 2022-03-03 NOTE — Transfer of Care (Signed)
Immediate Anesthesia Transfer of Care Note  Patient: Kimberly Sparks  Procedure(s) Performed: COLONOSCOPY WITH PROPOFOL POLYPECTOMY SUBMUCOSAL LIFTING INJECTION HEMOSTASIS CLIP PLACEMENT  Patient Location: PACU and Endoscopy Unit  Anesthesia Type:General  Level of Consciousness: awake  Airway & Oxygen Therapy: Patient Spontanous Breathing  Post-op Assessment: Report given to RN  Post vital signs: Reviewed  Last Vitals:  Vitals Value Taken Time  BP 111/65 03/03/22 1112  Temp 36.4 C 03/03/22 1112  Pulse 71 03/03/22 1112  Resp 21 03/03/22 1112  SpO2 100 % 03/03/22 1112    Last Pain:  Vitals:   03/03/22 1112  TempSrc: Oral  PainSc: 0-No pain      Patients Stated Pain Goal: 7 (16/10/96 0454)  Complications: There were no known notable events for this encounter.

## 2022-03-03 NOTE — H&P (Signed)
Kimberly Sparks is an 66 y.o. female.   Chief Complaint: CRC screening HPI: Kimberly Sparks is a 66 y.o. female with past medical history of GERD, hyperlipidemia, who presents for coming for screening colonoscopy.  The patient denies having any complaints such as melena, hematochezia, abdominal pain or distention, change in her bowel movement consistency or frequency, no changes in weight recently.  No family history of colorectal cancer.   Past Medical History:  Diagnosis Date   GERD (gastroesophageal reflux disease)    Hyperlipemia    Left thyroid nodule    PT FELT LUMP IN NECK - NO OTHER SYMPTOMS.   Seasonal allergies     Past Surgical History:  Procedure Laterality Date   dental graft  2014   THYROID LOBECTOMY Left 08/26/2013   DR Marlou Starks   THYROID LOBECTOMY Left 08/26/2013   Procedure: LEFT THYROID LOBECTOMY;  Surgeon: Merrie Roof, MD;  Location: [facility];  Service: General;  Laterality: Left;   WISDOM TOOTH EXTRACTION  1984    Family History  Problem Relation Age of Onset   Stroke Mother    Heart disease Mother    Hypertension Father    Diabetes Father    Social History:  reports that she has never smoked. She has been exposed to tobacco smoke. She has never used smokeless tobacco. She reports that she does not drink alcohol and does not use drugs.  Allergies:  Allergies  Allergen Reactions   Sulfa Antibiotics Itching and Rash    swelling     Medications Prior to Admission  Medication Sig Dispense Refill   atorvastatin (LIPITOR) 20 MG tablet Take 20 mg by mouth at bedtime.     cholecalciferol (VITAMIN D3) 25 MCG (1000 UNIT) tablet Take 1,000 Units by mouth daily.     fexofenadine (ALLEGRA) 180 MG tablet Take 180 mg by mouth daily.     ibuprofen (ADVIL,MOTRIN) 200 MG tablet Take 600 mg by mouth every 8 (eight) hours as needed for moderate pain or mild pain (Pain).     omeprazole (PRILOSEC) 20 MG capsule Take 20 mg by mouth at bedtime.      polyethylene  glycol-electrolytes (TRILYTE) 420 g solution Take 4,000 mLs by mouth as directed. 4000 mL 0   aspirin EC 81 MG tablet Take 81 mg by mouth daily.      No results found for this or any previous visit (from the past 48 hour(s)). No results found.  Review of Systems  All other systems reviewed and are negative.   Blood pressure (!) 165/94, pulse 77, temperature 98.4 F (36.9 C), temperature source Oral, resp. rate 20, height '5\' 3"'$  (1.6 m), weight 78 kg, SpO2 99 %. Physical Exam  GENERAL: The patient is AO x3, in no acute distress. HEENT: Head is normocephalic and atraumatic. EOMI are intact. Mouth is well hydrated and without lesions. NECK: Supple. No masses LUNGS: Clear to auscultation. No presence of rhonchi/wheezing/rales. Adequate chest expansion HEART: RRR, normal s1 and s2. ABDOMEN: Soft, nontender, no guarding, no peritoneal signs, and nondistended. BS +. No masses. EXTREMITIES: Without any cyanosis, clubbing, rash, lesions or edema. NEUROLOGIC: AOx3, no focal motor deficit. SKIN: no jaundice, no rashes  Assessment/Plan  Kimberly Sparks is a 66 y.o. female with past medical history of GERD, hyperlipidemia, who presents for coming for screening colonoscopy.  We will proceed with colonoscopy.  Harvel Quale, MD 03/03/2022, 10:18 AM

## 2022-03-03 NOTE — Op Note (Signed)
[facility] Patient Name: Kimberly Sparks Procedure Date: 03/03/2022 10:12 AM MRN: [id number] Date of Birth: 21-Sep-1955 Attending MD: Maylon Peppers , , [id number] CSN: [id number] Age: 66 Admit Type: Outpatient Procedure:                Colonoscopy Indications:              Screening for colorectal malignant neoplasm Providers:                Maylon Peppers, Rosina Lowenstein, RN, Casimer Bilis, Technician Referring MD:              Medicines:                Monitored Anesthesia Care Complications:            No immediate complications. Estimated Blood Loss:     Estimated blood loss: none. Procedure:                Pre-Anesthesia Assessment:                           - Prior to the procedure, a History and Physical                            was performed, and patient medications, allergies                            and sensitivities were reviewed. The patient's                            tolerance of previous anesthesia was reviewed.                           - The risks and benefits of the procedure and the                            sedation options and risks were discussed with the                            patient. All questions were answered and informed                            consent was obtained.                           - ASA Grade Assessment: II - A patient with mild                            systemic disease.                           After obtaining informed consent, the colonoscope                            was passed under direct vision. Throughout the  procedure, the patient's blood pressure, pulse, and                            oxygen saturations were monitored continuously. The                            [id number]-HQ190L ([id number]) scope was introduced through                            the anus and advanced to the the cecum, identified                            by appendiceal orifice and  ileocecal valve. The                            colonoscopy was performed without difficulty. The                            patient tolerated the procedure well. The quality                            of the bowel preparation was excellent. Scope In: 10:28:41 AM Scope Out: 11:07:58 AM Scope Withdrawal Time: 0 hours 34 minutes 40 seconds  Total Procedure Duration: 0 hours 39 minutes 17 seconds  Findings:      The perianal and digital rectal examinations were normal.      Three sessile polyps were found in the ascending colon and cecum. The       polyps were 3 to 6 mm in size. These polyps were removed with a cold       snare. Resection and retrieval were complete.      A 13 mm polyp was found in the proximal transverse colon. The polyp was       flat. Area was successfully injected with 3 mL Eleview for a lift       polypectomy. Imaging was performed using white light and narrow band       imaging to visualize the mucosa and demarcate the polyp site after       injection for EMR purposes. The polyp was removed with a hot snare.       Resection and retrieval were complete. To prevent bleeding after the       polypectomy, one hemostatic clip was successfully placed. Clip       manufacturer: [employer]. There was no bleeding at the end of the       procedure.      Scattered small-mouthed diverticula were found in the sigmoid colon,       descending colon and transverse colon.      Non-bleeding internal hemorrhoids were found during retroflexion. The       hemorrhoids were small. Impression:               - Three 3 to 6 mm polyps in the ascending colon and                            in the cecum, removed with a cold snare. Resected  and retrieved.                           - One 13 mm polyp in the proximal transverse colon,                            removed with a hot snare. Resected and retrieved.                            Injected. Clip was placed. Clip  manufacturer:                            [employer].                           - Diverticulosis in the sigmoid colon, in the                            descending colon and in the transverse colon.                           - Non-bleeding internal hemorrhoids. Moderate Sedation:      Per Anesthesia Care Recommendation:           - Discharge patient to home (ambulatory).                           - Resume previous diet.                           - Await pathology results.                           - Repeat colonoscopy for surveillance based on                            pathology results. Procedure Code(s):        --- Professional ---                           [phone], 59, Colonoscopy, flexible; with removal of                            tumor(s), polyp(s), or other lesion(s) by snare                            technique                           45381, Colonoscopy, flexible; with directed                            submucosal injection(s), any substance Diagnosis Code(s):        --- Professional ---                           Z12.11, Encounter for screening for malignant  neoplasm of colon                           D12.2, Benign neoplasm of ascending colon                           D12.0, Benign neoplasm of cecum                           D12.3, Benign neoplasm of transverse colon (hepatic                            flexure or splenic flexure)                           K64.8, Other hemorrhoids                           K57.30, Diverticulosis of large intestine without                            perforation or abscess without bleeding CPT copyright 2022 [employer]. All rights reserved. The codes documented in this report are preliminary and upon coder review may  be revised to meet current compliance requirements. Maylon Peppers, MD Maylon Peppers,  03/03/2022 11:13:20 AM This report has been signed electronically. Number of  Addenda: 0

## 2022-03-06 ENCOUNTER — Encounter (INDEPENDENT_AMBULATORY_CARE_PROVIDER_SITE_OTHER): Payer: Self-pay

## 2022-03-06 ENCOUNTER — Encounter (INDEPENDENT_AMBULATORY_CARE_PROVIDER_SITE_OTHER): Payer: Self-pay | Admitting: *Deleted

## 2022-03-07 LAB — SURGICAL PATHOLOGY

## 2022-03-08 ENCOUNTER — Encounter (HOSPITAL_COMMUNITY): Payer: Self-pay | Admitting: Gastroenterology

## 2022-05-03 DIAGNOSIS — B029 Zoster without complications: Secondary | ICD-10-CM | POA: Diagnosis not present

## 2022-05-09 DIAGNOSIS — H40013 Open angle with borderline findings, low risk, bilateral: Secondary | ICD-10-CM | POA: Diagnosis not present

## 2022-05-09 DIAGNOSIS — H524 Presbyopia: Secondary | ICD-10-CM | POA: Diagnosis not present

## 2022-09-11 DIAGNOSIS — D485 Neoplasm of uncertain behavior of skin: Secondary | ICD-10-CM | POA: Diagnosis not present

## 2022-09-11 DIAGNOSIS — L570 Actinic keratosis: Secondary | ICD-10-CM | POA: Diagnosis not present

## 2022-09-20 DIAGNOSIS — J209 Acute bronchitis, unspecified: Secondary | ICD-10-CM | POA: Diagnosis not present

## 2023-01-29 DIAGNOSIS — Z1231 Encounter for screening mammogram for malignant neoplasm of breast: Secondary | ICD-10-CM | POA: Diagnosis not present

## 2023-02-14 DIAGNOSIS — N6002 Solitary cyst of left breast: Secondary | ICD-10-CM | POA: Diagnosis not present

## 2023-02-14 DIAGNOSIS — R92322 Mammographic fibroglandular density, left breast: Secondary | ICD-10-CM | POA: Diagnosis not present

## 2023-02-14 DIAGNOSIS — N6320 Unspecified lump in the left breast, unspecified quadrant: Secondary | ICD-10-CM | POA: Diagnosis not present

## 2023-02-14 DIAGNOSIS — R928 Other abnormal and inconclusive findings on diagnostic imaging of breast: Secondary | ICD-10-CM | POA: Diagnosis not present

## 2023-03-01 DIAGNOSIS — G5601 Carpal tunnel syndrome, right upper limb: Secondary | ICD-10-CM | POA: Diagnosis not present

## 2023-03-01 DIAGNOSIS — R030 Elevated blood-pressure reading, without diagnosis of hypertension: Secondary | ICD-10-CM | POA: Diagnosis not present

## 2023-03-01 DIAGNOSIS — M25441 Effusion, right hand: Secondary | ICD-10-CM | POA: Diagnosis not present

## 2023-03-01 DIAGNOSIS — M1811 Unilateral primary osteoarthritis of first carpometacarpal joint, right hand: Secondary | ICD-10-CM | POA: Diagnosis not present

## 2023-03-06 DIAGNOSIS — E559 Vitamin D deficiency, unspecified: Secondary | ICD-10-CM | POA: Diagnosis not present

## 2023-03-06 DIAGNOSIS — Z1329 Encounter for screening for other suspected endocrine disorder: Secondary | ICD-10-CM | POA: Diagnosis not present

## 2023-03-06 DIAGNOSIS — E7849 Other hyperlipidemia: Secondary | ICD-10-CM | POA: Diagnosis not present

## 2023-03-06 DIAGNOSIS — Z0000 Encounter for general adult medical examination without abnormal findings: Secondary | ICD-10-CM | POA: Diagnosis not present

## 2023-03-06 DIAGNOSIS — Z13220 Encounter for screening for lipoid disorders: Secondary | ICD-10-CM | POA: Diagnosis not present

## 2023-03-06 DIAGNOSIS — K219 Gastro-esophageal reflux disease without esophagitis: Secondary | ICD-10-CM | POA: Diagnosis not present

## 2023-03-06 DIAGNOSIS — R5383 Other fatigue: Secondary | ICD-10-CM | POA: Diagnosis not present

## 2023-03-12 DIAGNOSIS — E890 Postprocedural hypothyroidism: Secondary | ICD-10-CM | POA: Diagnosis not present

## 2023-03-12 DIAGNOSIS — Z23 Encounter for immunization: Secondary | ICD-10-CM | POA: Diagnosis not present

## 2023-03-12 DIAGNOSIS — Z0001 Encounter for general adult medical examination with abnormal findings: Secondary | ICD-10-CM | POA: Diagnosis not present

## 2023-03-12 DIAGNOSIS — K219 Gastro-esophageal reflux disease without esophagitis: Secondary | ICD-10-CM | POA: Diagnosis not present

## 2023-03-12 DIAGNOSIS — G5601 Carpal tunnel syndrome, right upper limb: Secondary | ICD-10-CM | POA: Diagnosis not present

## 2023-03-12 DIAGNOSIS — Z833 Family history of diabetes mellitus: Secondary | ICD-10-CM | POA: Diagnosis not present

## 2023-03-12 DIAGNOSIS — E7849 Other hyperlipidemia: Secondary | ICD-10-CM | POA: Diagnosis not present

## 2023-03-12 DIAGNOSIS — N6002 Solitary cyst of left breast: Secondary | ICD-10-CM | POA: Diagnosis not present

## 2023-03-12 DIAGNOSIS — R030 Elevated blood-pressure reading, without diagnosis of hypertension: Secondary | ICD-10-CM | POA: Diagnosis not present

## 2023-03-12 DIAGNOSIS — D126 Benign neoplasm of colon, unspecified: Secondary | ICD-10-CM | POA: Diagnosis not present

## 2023-03-12 DIAGNOSIS — J309 Allergic rhinitis, unspecified: Secondary | ICD-10-CM | POA: Diagnosis not present

## 2023-09-11 DIAGNOSIS — L570 Actinic keratosis: Secondary | ICD-10-CM | POA: Diagnosis not present

## 2023-09-11 DIAGNOSIS — I781 Nevus, non-neoplastic: Secondary | ICD-10-CM | POA: Diagnosis not present

## 2024-02-11 DIAGNOSIS — H524 Presbyopia: Secondary | ICD-10-CM | POA: Diagnosis not present

## 2024-02-11 DIAGNOSIS — H43393 Other vitreous opacities, bilateral: Secondary | ICD-10-CM | POA: Diagnosis not present
